# Patient Record
Sex: Female | Born: 2000 | Race: White | Hispanic: No | Marital: Single | Smoking: Never smoker
Health system: Southern US, Community
[De-identification: ages and names within clinical notes are randomized; demographics above are authoritative.]

## PROBLEM LIST (undated history)

## (undated) DIAGNOSIS — S42302A Unspecified fracture of shaft of humerus, left arm, initial encounter for closed fracture: Secondary | ICD-10-CM

## (undated) DIAGNOSIS — O9852 Other viral diseases complicating childbirth: Secondary | ICD-10-CM

## (undated) HISTORY — DX: Unspecified fracture of shaft of humerus, left arm, initial encounter for closed fracture: S42.302A

## (undated) HISTORY — PX: NO PAST SURGERIES: SHX2092

---
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
## 2018-01-02 MED ORDER — ALPRAZOLAM 0.25 MG PO TABS: 0.2500 mg | ORAL_TABLET | Freq: Two times a day (BID) | ORAL | 0 refills | Status: DC | PRN

## 2018-01-02 NOTE — Telephone Encounter (Addendum)
Spoke with patient to let her know that we can send in 10 tablets, however she needs to follow-up with [facility] for any future refills.  Patient stated verbal understanding and made and appointment for November 13th.  Xanax RX has been sent to the [facility] on [address]

## 2018-01-02 NOTE — Telephone Encounter (Signed)
Please call pt: will refill # 10 but needs OV with cody in next week or 2 to f/u on anxiety. And need to clarify pharmacy.

## 2018-01-21 ENCOUNTER — Ambulatory Visit: Payer: Managed Care, Other (non HMO) | Admitting: Physician Assistant

## 2018-01-26 ENCOUNTER — Ambulatory Visit: Payer: Managed Care, Other (non HMO) | Admitting: Physician Assistant

## 2018-02-09 ENCOUNTER — Encounter: Payer: Self-pay | Admitting: Physician Assistant

## 2018-02-09 ENCOUNTER — Ambulatory Visit: Payer: Managed Care, Other (non HMO) | Admitting: Physician Assistant

## 2018-02-09 ENCOUNTER — Other Ambulatory Visit: Payer: Self-pay

## 2018-02-09 VITALS — BP 122/80 | HR 83 | Temp 98.3°F | Resp 14 | Ht 65.0 in | Wt 159.0 lb

## 2018-02-09 DIAGNOSIS — F411 Generalized anxiety disorder: Secondary | ICD-10-CM | POA: Diagnosis not present

## 2018-02-09 MED ORDER — CITALOPRAM HYDROBROMIDE 10 MG PO TABS: 10.0000 mg | ORAL_TABLET | Freq: Every day | ORAL | 1 refills | Status: DC

## 2018-02-09 NOTE — Assessment & Plan Note (Signed)
Will start Citalopram 10 mg tablet daily. Continue Xanax on PRN basis for severe acute anxiety only. Follow-up 6 weeks after the holiday season.

## 2018-02-09 NOTE — Progress Notes (Signed)
Patient presents to clinic today for follow-up of anxiety. Notes that the xanax does help with panic attacks at work but does make her sleepy. Notes that she is noting more generalized anxiety as well with work. Notes she is a worry wart and gets herself worked up over the smallest things. Denies depressed mood or anhedonia. Denies SI/HI.   Past Medical History:  Diagnosis Date  . Closed left arm fracture     Current Outpatient Medications on File Prior to Visit  Medication Sig Dispense Refill  . ALPRAZolam (XANAX) 0.25 MG tablet Take 1 tablet (0.25 mg total) by mouth 2 (two) times daily as needed for anxiety. 10 tablet 0  . ENSKYCE 0.15-30 MG-MCG tablet Take 1 tablet by mouth daily.  4  . fluticasone (FLONASE) 50 MCG/ACT nasal spray Place 2 sprays into both nostrils daily. 16 g 0   No current facility-administered medications on file prior to visit.     No Known Allergies  Family History  Problem Relation Age of Onset  . Hypertension Mother   . Diabetes Father   . Hypertension Maternal Uncle   . Diabetes Maternal Uncle   . Hypertension Maternal Grandmother   . Hypertension Maternal Grandfather   . Cancer Maternal Grandfather        Lung and Bladder  . Diabetes Maternal Grandfather   . Healthy Sister        x 4    Social History   Socioeconomic History  . Marital status: Single    Spouse name: Not on file  . Number of children: 0  . Years of education: Not on file  . Highest education level: Not on file  Occupational History  . Occupation: Consulting civil engineer    Comment: Recruitment consultant  Social Needs  . Financial resource strain: Not on file  . Food insecurity:    Worry: Not on file    Inability: Not on file  . Transportation needs:    Medical: Not on file    Non-medical: Not on file  Tobacco Use  . Smoking status: Never Smoker  . Smokeless tobacco: Never Used  Substance and Sexual Activity  . Alcohol use: No  . Drug use: No  . Sexual activity: Never    Birth  control/protection: Pill  Lifestyle  . Physical activity:    Days per week: Not on file    Minutes per session: Not on file  . Stress: Not on file  Relationships  . Social connections:    Talks on phone: Not on file    Gets together: Not on file    Attends religious service: Not on file    Active member of club or organization: Not on file    Attends meetings of clubs or organizations: Not on file    Relationship status: Not on file  Other Topics Concern  . Not on file  Social History Narrative  . Not on file   Review of Systems - See HPI.  All other ROS are negative.  BP 122/80   Pulse 83   Temp 98.3 F (36.8 C) (Oral)   Resp 14   Ht 5\' 5"  (1.651 m)   Wt 159 lb (72.1 kg)   SpO2 99%   BMI 26.46 kg/m   Physical Exam  Constitutional: She is oriented to person, place, and time. She appears well-developed and well-nourished.  HENT:  Head: Normocephalic and atraumatic.  Right Ear: External ear normal.  Left Ear: External ear normal.  Eyes: Conjunctivae  are normal.  Neck: Neck supple.  Cardiovascular: Normal rate, regular rhythm, normal heart sounds and intact distal pulses.  Pulmonary/Chest: Effort normal and breath sounds normal. No stridor. No respiratory distress. She has no wheezes. She has no rales. She exhibits no tenderness.  Neurological: She is alert and oriented to person, place, and time.  Psychiatric: She has a normal mood and affect.  Vitals reviewed.  Assessment/Plan: Generalized anxiety disorder Will start Citalopram 10 mg tablet daily. Continue Xanax on PRN basis for severe acute anxiety only. Follow-up 6 weeks after the holiday season.      Piedad ClimesWilliam Cody Padraig Nhan, PA-C

## 2018-02-09 NOTE — Patient Instructions (Addendum)
Please start the Citalopram 10 mg tablet, taking daily as directed.  Continue the Xanax on an as needed basis for severe acute anxiety only.  Keep active and eat a well-balanced diet.  Follow-up in 6-8 weeks for reassessment. Return sooner if needed.

## 2018-03-11 NOTE — L&D Delivery Note (Signed)
Delivery Note Pt pushed very well for about 33minutes.  At 7:01 PM a viable and healthy female was delivered via Vaginal, Spontaneous (Presentation: OA; LOT ).  APGAR: 8, 9; weight  P.   Placenta status: delivered, intact .  Cord: 3VC  with the following complications: none.    Anesthesia:  epidural Episiotomy: None Lacerations: Labial Suture Repair: 3.0 vicryl rapide Est. Blood Loss (mL): 256cc  Mom to postpartum.  Baby to Couplet care / Skin to Skin.  Federick Levene Bovard-Stuckert 12/26/2018, 7:51 PM  Circumcision d/w pt r/b/a, wishes to proceed in office  A+/RI/Tdap in office/Contra?/Bo

## 2018-03-20 ENCOUNTER — Other Ambulatory Visit: Payer: Self-pay

## 2018-03-20 ENCOUNTER — Ambulatory Visit: Payer: Managed Care, Other (non HMO) | Admitting: Physician Assistant

## 2018-03-20 ENCOUNTER — Encounter: Payer: Self-pay | Admitting: Physician Assistant

## 2018-03-20 VITALS — BP 102/78 | HR 80 | Temp 98.3°F | Resp 16 | Ht 65.0 in | Wt 154.0 lb

## 2018-03-20 DIAGNOSIS — J069 Acute upper respiratory infection, unspecified: Secondary | ICD-10-CM

## 2018-03-20 DIAGNOSIS — B9789 Other viral agents as the cause of diseases classified elsewhere: Secondary | ICD-10-CM | POA: Diagnosis not present

## 2018-03-20 DIAGNOSIS — H66001 Acute suppurative otitis media without spontaneous rupture of ear drum, right ear: Secondary | ICD-10-CM

## 2018-03-20 MED ORDER — AMOXICILLIN 875 MG PO TABS: 875.0000 mg | ORAL_TABLET | Freq: Two times a day (BID) | ORAL | 0 refills | Status: DC

## 2018-03-20 MED ORDER — HYDROCOD POLST-CPM POLST ER 10-8 MG/5ML PO SUER: 5.0000 mL | Freq: Two times a day (BID) | ORAL | 0 refills | Status: DC | PRN

## 2018-03-20 NOTE — Patient Instructions (Signed)
Please keep well-hydrated and get plenty of rest. Start a saline nasal rinse daily. Salt-water gargles and tylenol or ibuprofen will be beneficial for sore throat. Take the prescription cough medication as directed.  If you not worsening ear pressure or pain over the weekend, fill the prescription for the antibiotic and take as directed.

## 2018-03-20 NOTE — Progress Notes (Signed)
Acute Office Visit  Subjective:    Patient ID: Ashley Buckley, female    DOB: 2001-02-24, 18 y.o.   MRN: [id number]  Chief Complaint  Patient presents with  . URI    Patient is in today for 4 days of dry cough, sore throat, nasal congestion and post-nasal drip. Notes initially having a fever with Tmax of 102 but this resolved on day 2 of symptoms. Denies recurrence since. Notes mild headache and sinus pressure. Feels that symptoms are improving overall but cough is keeping her up at night. Denies recent travel or sick contact.  Past Medical History:  Diagnosis Date  . Closed left arm fracture     Past Surgical History:  Procedure Laterality Date  . NO PAST SURGERIES      Family History  Problem Relation Age of Onset  . Hypertension Mother   . Diabetes Father   . Hypertension Maternal Uncle   . Diabetes Maternal Uncle   . Hypertension Maternal Grandmother   . Hypertension Maternal Grandfather   . Cancer Maternal Grandfather        Lung and Bladder  . Diabetes Maternal Grandfather   . Healthy Sister        x 4    Social History   Socioeconomic History  . Marital status: Single    Spouse name: Not on file  . Number of children: 0  . Years of education: Not on file  . Highest education level: Not on file  Occupational History  . Occupation: Consulting civil engineer    Comment: Recruitment consultant  Social Needs  . Financial resource strain: Not on file  . Food insecurity:    Worry: Not on file    Inability: Not on file  . Transportation needs:    Medical: Not on file    Non-medical: Not on file  Tobacco Use  . Smoking status: Never Smoker  . Smokeless tobacco: Never Used  Substance and Sexual Activity  . Alcohol use: No  . Drug use: No  . Sexual activity: Never    Birth control/protection: Pill  Lifestyle  . Physical activity:    Days per week: Not on file    Minutes per session: Not on file  . Stress: Not on file  Relationships  . Social connections:    Talks on  phone: Not on file    Gets together: Not on file    Attends religious service: Not on file    Active member of club or organization: Not on file    Attends meetings of clubs or organizations: Not on file    Relationship status: Not on file  . Intimate partner violence:    Fear of current or ex partner: Not on file    Emotionally abused: Not on file    Physically abused: Not on file    Forced sexual activity: Not on file  Other Topics Concern  . Not on file  Social History Narrative  . Not on file    Outpatient Medications Prior to Visit  Medication Sig Dispense Refill  . ALPRAZolam (XANAX) 0.25 MG tablet Take 1 tablet (0.25 mg total) by mouth 2 (two) times daily as needed for anxiety. 10 tablet 0  . citalopram (CELEXA) 10 MG tablet Take 1 tablet (10 mg total) by mouth daily. 30 tablet 1  . ENSKYCE 0.15-30 MG-MCG tablet Take 1 tablet by mouth daily.  4  . fluticasone (FLONASE) 50 MCG/ACT nasal spray Place 2 sprays into both nostrils daily. (  Patient not taking: Reported on 03/20/2018) 16 g 0   No facility-administered medications prior to visit.     No Known Allergies  Review of Systems  Constitutional: Positive for malaise/fatigue.  HENT: Positive for congestion and sinus pain. Negative for ear pain.   Eyes: Negative.   Respiratory: Positive for cough.   Cardiovascular: Positive for chest pain.  Gastrointestinal: Negative for diarrhea, nausea and vomiting.  Genitourinary: Negative.   Musculoskeletal: Negative for back pain, falls and neck pain.  Neurological: Positive for headaches. Negative for dizziness.       Objective:    Physical Exam  Constitutional: She is oriented to person, place, and time. She appears well-developed and well-nourished.  HENT:  Head: Normocephalic.  Right Ear: Tympanic membrane is erythematous.  Left Ear: Tympanic membrane is erythematous.  Mouth/Throat: Uvula is midline and mucous membranes are normal. Posterior oropharyngeal erythema  present.  Eyes: Pupils are equal, round, and reactive to light. Conjunctivae are normal.  Neck: Normal range of motion. Neck supple.  Cardiovascular: Normal rate, regular rhythm and normal heart sounds.  Pulmonary/Chest: Effort normal and breath sounds normal.  Neurological: She is alert and oriented to person, place, and time.  Skin: Skin is warm and dry.    BP 102/78   Pulse 80   Temp 98.3 F (36.8 C) (Oral)   Resp 16   Ht 5\' 5"  (1.651 m)   Wt 69.9 kg   SpO2 99%   BMI 25.63 kg/m  Wt Readings from Last 3 Encounters:  03/20/18 69.9 kg (83 %, Z= 0.97)*  02/09/18 72.1 kg (87 %, Z= 1.12)*  11/19/17 73.5 kg (89 %, Z= 1.21)*   * Growth percentiles are based on CDC (Girls, 2-20 Years) data.    There are no preventive care reminders to display for this patient.  There are no preventive care reminders to display for this patient.   No results found for: TSH Lab Results  Component Value Date   WBC 10.3 07/23/2017   HGB 14.7 07/23/2017   HCT 43.3 07/23/2017   MCV 93.7 07/23/2017   PLT 340.0 07/23/2017   Lab Results  Component Value Date   NA 140 07/23/2017   K 4.0 07/23/2017   CO2 23 07/23/2017   GLUCOSE 90 07/23/2017   BUN 8 07/23/2017   CREATININE 0.75 07/23/2017   BILITOT 0.5 07/23/2017   ALKPHOS 23 (L) 07/23/2017   AST 9 07/23/2017   ALT 11 07/23/2017   PROT 7.6 07/23/2017   ALBUMIN 4.6 07/23/2017   CALCIUM 9.8 07/23/2017   GFR 105.67 07/23/2017   No results found for: CHOL No results found for: HDL No results found for: LDLCALC No results found for: TRIG No results found for: CHOLHDL No results found for: WGYK5L     Assessment & Plan:   1. Viral URI with cough Supportive measures and OTC medications reviewed. Rx Tussionex for cough. Follow-up if not continuing to improve.  2. Non-recurrent acute suppurative otitis media of right ear without spontaneous rupture of tympanic membrane Mild erythema and yellow fluid noted behind TM. No bulging noted. Will  have her monitor for ear pain over the next 24-48 hours. If recurring she is to start Amoxicillin and take as directed.   No orders of the defined types were placed in this encounter.    Erin E Mecum, Student-PA

## 2018-04-06 ENCOUNTER — Ambulatory Visit: Payer: Managed Care, Other (non HMO) | Admitting: Physician Assistant

## 2018-04-06 DIAGNOSIS — Z0289 Encounter for other administrative examinations: Secondary | ICD-10-CM

## 2018-06-03 LAB — HIV ANTIBODY (ROUTINE TESTING W REFLEX): HIV 1&2 Ab, 4th Generation: NEGATIVE

## 2018-12-24 ENCOUNTER — Other Ambulatory Visit: Payer: Self-pay

## 2018-12-24 ENCOUNTER — Ambulatory Visit (INDEPENDENT_AMBULATORY_CARE_PROVIDER_SITE_OTHER): Payer: Managed Care, Other (non HMO) | Admitting: Pediatrics

## 2018-12-24 DIAGNOSIS — Z7681 Expectant parent(s) prebirth pediatrician visit: Secondary | ICD-10-CM

## 2018-12-24 NOTE — Progress Notes (Signed)
Prenatal counseling for impending newborn done-- visit done over phone.  1st child, no complications, currently 38.2wks, prenatal early Z88.81

## 2018-12-26 ENCOUNTER — Inpatient Hospital Stay (HOSPITAL_COMMUNITY): Payer: Managed Care, Other (non HMO) | Admitting: Anesthesiology

## 2018-12-26 ENCOUNTER — Inpatient Hospital Stay (HOSPITAL_COMMUNITY)
Admission: AD | Admit: 2018-12-26 | Discharge: 2018-12-28 | DRG: 805 | Disposition: A | Discharge status: Home / Self Care | Payer: Managed Care, Other (non HMO) | Attending: Obstetrics and Gynecology | Admitting: Obstetrics and Gynecology

## 2018-12-26 ENCOUNTER — Encounter (HOSPITAL_COMMUNITY): Payer: Self-pay | Admitting: Student

## 2018-12-26 ENCOUNTER — Other Ambulatory Visit: Payer: Self-pay

## 2018-12-26 DIAGNOSIS — Z3A38 38 weeks gestation of pregnancy: Secondary | ICD-10-CM

## 2018-12-26 DIAGNOSIS — O9852 Other viral diseases complicating childbirth: Principal | ICD-10-CM

## 2018-12-26 DIAGNOSIS — Z3493 Encounter for supervision of normal pregnancy, unspecified, third trimester: Secondary | ICD-10-CM

## 2018-12-26 DIAGNOSIS — E669 Obesity, unspecified: Secondary | ICD-10-CM | POA: Diagnosis present

## 2018-12-26 DIAGNOSIS — O26893 Other specified pregnancy related conditions, third trimester: Secondary | ICD-10-CM | POA: Diagnosis present

## 2018-12-26 DIAGNOSIS — U071 COVID-19: Secondary | ICD-10-CM | POA: Diagnosis present

## 2018-12-26 DIAGNOSIS — O99214 Obesity complicating childbirth: Secondary | ICD-10-CM | POA: Diagnosis present

## 2018-12-26 HISTORY — DX: Other viral diseases complicating childbirth: O98.52

## 2018-12-26 HISTORY — DX: COVID-19: U07.1

## 2018-12-26 LAB — CBC
HCT: 37.5 % (ref 36.0–46.0)
Hemoglobin: 12.3 g/dL (ref 12.0–15.0)
MCH: 30.3 pg (ref 26.0–34.0)
MCHC: 32.8 g/dL (ref 30.0–36.0)
MCV: 92.4 fL (ref 80.0–100.0)
Platelets: 328 10*3/uL (ref 150–400)
RBC: 4.06 MIL/uL (ref 3.87–5.11)
RDW: 12.4 % (ref 11.5–15.5)
WBC: 12.7 10*3/uL — ABNORMAL HIGH (ref 4.0–10.5)
nRBC: 0 % (ref 0.0–0.2)

## 2018-12-26 LAB — COMPREHENSIVE METABOLIC PANEL
ALT: 16 U/L (ref 0–44)
AST: 18 U/L (ref 15–41)
Albumin: 3.1 g/dL — ABNORMAL LOW (ref 3.5–5.0)
Alkaline Phosphatase: 134 U/L — ABNORMAL HIGH (ref 38–126)
Anion gap: 12 (ref 5–15)
BUN: 5 mg/dL — ABNORMAL LOW (ref 6–20)
CO2: 19 mmol/L — ABNORMAL LOW (ref 22–32)
Calcium: 8.8 mg/dL — ABNORMAL LOW (ref 8.9–10.3)
Chloride: 107 mmol/L (ref 98–111)
Creatinine, Ser: 0.73 mg/dL (ref 0.44–1.00)
GFR calc Af Amer: >60 mL/min (ref >60)
GFR calc non Af Amer: >60 mL/min (ref >60)
Glucose, Bld: 84 mg/dL (ref 70–99)
Potassium: 3.8 mmol/L (ref 3.5–5.1)
Sodium: 138 mmol/L (ref 135–145)
Total Bilirubin: 0.8 mg/dL (ref 0.3–1.2)
Total Protein: 6.4 g/dL — ABNORMAL LOW (ref 6.5–8.1)

## 2018-12-26 LAB — TYPE AND SCREEN
ABO/RH(D): A POS
Antibody Screen: NEGATIVE

## 2018-12-26 LAB — SARS CORONAVIRUS 2 BY RT PCR (HOSPITAL ORDER, PERFORMED IN ~~LOC~~ HOSPITAL LAB): SARS Coronavirus 2: POSITIVE — AB

## 2018-12-26 LAB — PROTEIN / CREATININE RATIO, URINE
Creatinine, Urine: 100.6 mg/dL
Protein Creatinine Ratio: 0.09 mg/mg{Cre} (ref 0.00–0.15)
Total Protein, Urine: 9 mg/dL

## 2018-12-26 LAB — ABO/RH: ABO/RH(D): A POS

## 2018-12-26 LAB — POCT FERN TEST: POCT Fern Test: NEGATIVE

## 2018-12-26 MED ORDER — LACTATED RINGERS IV SOLN
INTRAVENOUS | Status: DC
  Administered 2018-12-26: 15:00:00 via INTRAVENOUS

## 2018-12-26 MED ORDER — LACTATED RINGERS IV SOLN: 500.0000 mL | INTRAVENOUS | Status: DC | PRN

## 2018-12-26 MED ORDER — OXYTOCIN 40 UNITS IN NORMAL SALINE INFUSION - SIMPLE MED: 2.5000 [IU]/h | INTRAVENOUS | Status: DC

## 2018-12-26 MED ORDER — ONDANSETRON HCL 4 MG/2ML IJ SOLN
4.0000 mg | Freq: Four times a day (QID) | INTRAMUSCULAR | Status: DC | PRN
  Administered 2018-12-26: 4 mg via INTRAVENOUS
  Filled 2018-12-26: qty 2

## 2018-12-26 MED ORDER — OXYCODONE HCL 5 MG PO TABS: 5.0000 mg | ORAL_TABLET | ORAL | Status: DC | PRN

## 2018-12-26 MED ORDER — PRENATAL MULTIVITAMIN CH
1.0000 | ORAL_TABLET | Freq: Every day | ORAL | Status: DC
  Administered 2018-12-27 – 2018-12-28 (×2): 1 via ORAL
  Filled 2018-12-26 (×2): qty 1

## 2018-12-26 MED ORDER — PHENYLEPHRINE 40 MCG/ML (10ML) SYRINGE FOR IV PUSH (FOR BLOOD PRESSURE SUPPORT)
80.0000 ug | PREFILLED_SYRINGE | INTRAVENOUS | Status: DC | PRN
  Filled 2018-12-26: qty 10

## 2018-12-26 MED ORDER — ONDANSETRON HCL 4 MG/2ML IJ SOLN: 4.0000 mg | INTRAMUSCULAR | Status: DC | PRN

## 2018-12-26 MED ORDER — FENTANYL-BUPIVACAINE-NACL 0.5-0.125-0.9 MG/250ML-% EP SOLN
12.0000 mL/h | EPIDURAL | Status: DC | PRN
  Filled 2018-12-26: qty 250

## 2018-12-26 MED ORDER — OXYCODONE-ACETAMINOPHEN 5-325 MG PO TABS: 2.0000 | ORAL_TABLET | ORAL | Status: DC | PRN

## 2018-12-26 MED ORDER — OXYCODONE-ACETAMINOPHEN 5-325 MG PO TABS: 1.0000 | ORAL_TABLET | ORAL | Status: DC | PRN

## 2018-12-26 MED ORDER — OXYTOCIN 40 UNITS IN NORMAL SALINE INFUSION - SIMPLE MED
2.5000 [IU]/h | INTRAVENOUS | Status: DC
  Filled 2018-12-26: qty 1000

## 2018-12-26 MED ORDER — FLEET ENEMA 7-19 GM/118ML RE ENEM: 1.0000 | ENEMA | RECTAL | Status: DC | PRN

## 2018-12-26 MED ORDER — BUTORPHANOL TARTRATE 1 MG/ML IJ SOLN: 1.0000 mg | INTRAMUSCULAR | Status: DC | PRN

## 2018-12-26 MED ORDER — OXYTOCIN BOLUS FROM INFUSION: 500.0000 mL | Freq: Once | INTRAVENOUS | Status: DC

## 2018-12-26 MED ORDER — SOD CITRATE-CITRIC ACID 500-334 MG/5ML PO SOLN: 30.0000 mL | ORAL | Status: DC | PRN

## 2018-12-26 MED ORDER — ACETAMINOPHEN 325 MG PO TABS: 650.0000 mg | ORAL_TABLET | ORAL | Status: DC | PRN

## 2018-12-26 MED ORDER — TERBUTALINE SULFATE 1 MG/ML IJ SOLN: 0.2500 mg | Freq: Once | INTRAMUSCULAR | Status: DC | PRN

## 2018-12-26 MED ORDER — LACTATED RINGERS IV SOLN
500.0000 mL | Freq: Once | INTRAVENOUS | Status: AC
  Administered 2018-12-26: 500 mL via INTRAVENOUS

## 2018-12-26 MED ORDER — ONDANSETRON HCL 4 MG/2ML IJ SOLN: 4.0000 mg | Freq: Four times a day (QID) | INTRAMUSCULAR | Status: DC | PRN

## 2018-12-26 MED ORDER — BENZOCAINE-MENTHOL 20-0.5 % EX AERO
1.0000 "application " | INHALATION_SPRAY | CUTANEOUS | Status: DC | PRN
  Administered 2018-12-27 – 2018-12-28 (×2): 1 via TOPICAL
  Filled 2018-12-26 (×2): qty 56

## 2018-12-26 MED ORDER — COCONUT OIL OIL: 1.0000 "application " | TOPICAL_OIL | Status: DC | PRN

## 2018-12-26 MED ORDER — EPHEDRINE 5 MG/ML INJ: 10.0000 mg | INTRAVENOUS | Status: DC | PRN

## 2018-12-26 MED ORDER — SODIUM CHLORIDE (PF) 0.9 % IJ SOLN
INTRAMUSCULAR | Status: DC | PRN
  Administered 2018-12-26: 12 mL/h via EPIDURAL

## 2018-12-26 MED ORDER — SENNOSIDES-DOCUSATE SODIUM 8.6-50 MG PO TABS
2.0000 | ORAL_TABLET | ORAL | Status: DC
  Administered 2018-12-27 (×2): 2 via ORAL
  Filled 2018-12-26 (×2): qty 2

## 2018-12-26 MED ORDER — SIMETHICONE 80 MG PO CHEW: 80.0000 mg | CHEWABLE_TABLET | ORAL | Status: DC | PRN

## 2018-12-26 MED ORDER — PHENYLEPHRINE 40 MCG/ML (10ML) SYRINGE FOR IV PUSH (FOR BLOOD PRESSURE SUPPORT): 80.0000 ug | PREFILLED_SYRINGE | INTRAVENOUS | Status: DC | PRN

## 2018-12-26 MED ORDER — WITCH HAZEL-GLYCERIN EX PADS: 1.0000 "application " | MEDICATED_PAD | CUTANEOUS | Status: DC | PRN

## 2018-12-26 MED ORDER — DIPHENHYDRAMINE HCL 50 MG/ML IJ SOLN: 12.5000 mg | INTRAMUSCULAR | Status: DC | PRN

## 2018-12-26 MED ORDER — ZOLPIDEM TARTRATE 5 MG PO TABS: 5.0000 mg | ORAL_TABLET | Freq: Every evening | ORAL | Status: DC | PRN

## 2018-12-26 MED ORDER — LIDOCAINE HCL (PF) 1 % IJ SOLN: 30.0000 mL | INTRAMUSCULAR | Status: DC | PRN

## 2018-12-26 MED ORDER — LACTATED RINGERS IV SOLN
INTRAVENOUS | Status: DC
  Administered 2018-12-26: 11:00:00 via INTRAVENOUS

## 2018-12-26 MED ORDER — IBUPROFEN 600 MG PO TABS
600.0000 mg | ORAL_TABLET | Freq: Four times a day (QID) | ORAL | Status: DC
  Administered 2018-12-27 – 2018-12-28 (×7): 600 mg via ORAL
  Filled 2018-12-26 (×7): qty 1

## 2018-12-26 MED ORDER — LIDOCAINE HCL (PF) 1 % IJ SOLN
INTRAMUSCULAR | Status: DC | PRN
  Administered 2018-12-26: 5 mL via EPIDURAL
  Administered 2018-12-26: 4 mL via EPIDURAL

## 2018-12-26 MED ORDER — CITALOPRAM HYDROBROMIDE 10 MG PO TABS
10.0000 mg | ORAL_TABLET | Freq: Every day | ORAL | Status: DC
  Filled 2018-12-26: qty 1

## 2018-12-26 MED ORDER — DIPHENHYDRAMINE HCL 25 MG PO CAPS: 25.0000 mg | ORAL_CAPSULE | Freq: Four times a day (QID) | ORAL | Status: DC | PRN

## 2018-12-26 MED ORDER — ONDANSETRON HCL 4 MG PO TABS: 4.0000 mg | ORAL_TABLET | ORAL | Status: DC | PRN

## 2018-12-26 MED ORDER — LACTATED RINGERS IV SOLN: INTRAVENOUS | Status: DC

## 2018-12-26 MED ORDER — DIBUCAINE (PERIANAL) 1 % EX OINT: 1.0000 "application " | TOPICAL_OINTMENT | CUTANEOUS | Status: DC | PRN

## 2018-12-26 MED ORDER — OXYTOCIN BOLUS FROM INFUSION
500.0000 mL | Freq: Once | INTRAVENOUS | Status: AC
  Administered 2018-12-26: 500 mL via INTRAVENOUS

## 2018-12-26 MED ORDER — OXYCODONE HCL 5 MG PO TABS: 10.0000 mg | ORAL_TABLET | ORAL | Status: DC | PRN

## 2018-12-26 NOTE — Anesthesia Preprocedure Evaluation (Addendum)
Anesthesia Evaluation  Patient identified by MRN, date of birth, ID band Patient awake    Reviewed: Allergy & Precautions, NPO status , Patient's Chart, lab work & pertinent test results  History of Anesthesia Complications Negative for: history of anesthetic complications  Airway Mallampati: II   Neck ROM: Full    Dental no notable dental hx.    Pulmonary neg pulmonary ROS,    Pulmonary exam normal        Cardiovascular negative cardio ROS Normal cardiovascular exam     Neuro/Psych PSYCHIATRIC DISORDERS Anxiety negative neurological ROS     GI/Hepatic negative GI ROS, Neg liver ROS,   Endo/Other   Obesity   Renal/GU negative Renal ROS     Musculoskeletal negative musculoskeletal ROS (+)   Abdominal   Peds  Hematology negative hematology ROS (+)  Plt 328k    Anesthesia Other Findings Covid+  Reproductive/Obstetrics (+) Pregnancy                          Anesthesia Physical Anesthesia Plan  ASA: II  Anesthesia Plan: Epidural   Post-op Pain Management:    Induction:   PONV Risk Score and Plan: 2 and Treatment may vary due to age or medical condition  Airway Management Planned: Natural Airway  Additional Equipment: None  Intra-op Plan:   Post-operative Plan:   Informed Consent: I have reviewed the patients History and Physical, chart, labs and discussed the procedure including the risks, benefits and alternatives for the proposed anesthesia with the patient or authorized representative who has indicated his/her understanding and acceptance.       Plan Discussed with: Anesthesiologist  Anesthesia Plan Comments: (Labs reviewed. Platelets acceptable, patient not taking any blood thinning medications. Per RN, FHR tracing reported to be stable enough for sitting procedure. Risks and benefits discussed with patient, including PDPH, backache, epidural hematoma, failed  epidural, blood pressure changes, allergic reaction, and nerve injury. Patient expressed understanding and wished to proceed.)       Anesthesia Quick Evaluation

## 2018-12-26 NOTE — Anesthesia Procedure Notes (Signed)
Epidural Patient location during procedure: OB Start time: 12/26/2018 3:11 PM End time: 12/26/2018 3:15 PM  Staffing Anesthesiologist: Audry Pili, MD Performed: anesthesiologist   Preanesthetic Checklist Completed: patient identified, pre-op evaluation, timeout performed, IV checked, risks and benefits discussed and monitors and equipment checked  Epidural Patient position: sitting Prep: DuraPrep Patient monitoring: continuous pulse ox and blood pressure Approach: midline Location: L2-L3 Injection technique: LOR saline  Needle:  Needle type: Tuohy  Needle gauge: 17 G Needle length: 9 cm Needle insertion depth: 6 cm Catheter size: 19 Gauge Catheter at skin depth: 11 cm Test dose: negative and Other (1% lidocaine)  Assessment Events: blood not aspirated  Additional Notes Patient identified. Risks including, but not limited to, bleeding, infection, nerve damage, paralysis, inadequate analgesia, blood pressure changes, nausea, vomiting, allergic reaction, postpartum back pain, itching, and headache were discussed. Patient expressed understanding and wished to proceed. Sterile prep and drape, including hand hygiene, mask, and sterile gloves were used. The patient was positioned and the spine was prepped. The skin was anesthetized with lidocaine. No paraesthesia or other complication noted. The patient did not experience any signs of intravascular injection such as tinnitus or metallic taste in mouth, nor signs of intrathecal spread such as rapid motor block. Please see nursing notes for vital signs. The patient tolerated the procedure well.   Ashley Buckley, MDReason for block:procedure for pain

## 2018-12-26 NOTE — H&P (Signed)
Ashley Buckley is a 18 y.o. female G1 P0 at 38+ with increasing ctx and increased discharge.  Pt with cervical change since last exam.  Relatively uncomplicated PNC.  Had h/o low-lying placenta - resolved.  Received Tdap and Flu in [facility].  Normal genetic screening.    OB History    Gravida  1   Para      Term      Preterm      AB      Living        SAB      TAB      Ectopic      Multiple      Live Births            G1 present  No pap H/o Chl, neg testing in pregnancy  Past Medical History:  Diagnosis Date  . Closed left arm fracture    Past Surgical History:  Procedure Laterality Date  . NO PAST SURGERIES     Family History: family history includes Cancer in her maternal grandfather; Diabetes in her father, maternal grandfather, and maternal uncle; Healthy in her sister; Hypertension in her maternal grandfather, maternal grandmother, maternal uncle, and mother. Social History:  reports that she has never smoked. She has never used smokeless tobacco. She reports that she does not drink alcohol or use drugs.insurance agency, single in long-term relationship  Meds PNV All NKDA     Maternal Diabetes: No Genetic Screening: Normal Maternal Ultrasounds/Referrals: Normal Fetal Ultrasounds or other Referrals:  None Maternal Substance Abuse:  No Significant Maternal Medications:  None Significant Maternal Lab Results:  Group B Strep negative Other Comments:  None  Review of Systems  Constitutional: Negative.   HENT: Negative.   Eyes: Negative.   Respiratory: Negative.   Cardiovascular: Negative.   Gastrointestinal: Negative.   Genitourinary: Negative.   Musculoskeletal: Positive for back pain.  Skin: Negative.   Neurological: Negative.   Psychiatric/Behavioral: Negative.    Maternal Medical History:  Reason for admission: Contractions.   Contractions: Frequency: regular.   Perceived severity is moderate.    Fetal activity: Perceived fetal activity  is normal.    Prenatal Complications - Diabetes: none.    Dilation: 6 Effacement (%): 80 Station: -3 Exam by:: christina robinson rn Blood pressure 130/89, pulse 93, temperature 98.5 F (36.9 C), temperature source Oral, resp. rate 17, height 5\' 5"  (1.651 m), weight 83.9 kg, SpO2 100 %. Maternal Exam:  Uterine Assessment: Contraction strength is moderate.  Contraction frequency is regular.   Abdomen: Patient reports no abdominal tenderness. Fundal height is appropriate for gestation.   Estimated fetal weight is 7.5#.   Fetal presentation: vertex  Introitus: Normal vulva. Normal vagina.    Physical Exam  Constitutional: She is oriented to person, place, and time. She appears well-developed and well-nourished.  HENT:  Head: Normocephalic and atraumatic.  Cardiovascular: Normal rate and regular rhythm.  Respiratory: Effort normal and breath sounds normal. No respiratory distress. She has no wheezes.  GI: Soft. Bowel sounds are normal. She exhibits no distension. There is no abdominal tenderness.  Genitourinary:    Vulva normal.   Musculoskeletal: Normal range of motion.  Neurological: She is alert and oriented to person, place, and time.  Skin: Skin is warm and dry.  Psychiatric: She has a normal mood and affect. Her behavior is normal.    Prenatal labs: ABO, Rh: --/--/A POS, A POS Performed at [facility], [address] [place], [place]  (  10/17 1104) Antibody: NEG (10/17 1104) Rubella:  immune RPR:   NR HBsAg:   neg HIV:   neg GBS:   neg  Tdap 8/12, Flu 3/20  Hgb 14/2/plt 309/Ur Cx neg/GC neg/Chl neg/Varicella immune/Hgb electro WNL/First tri screen WNL/glucola 124 Essential panel neg (CF, SMA, fragile X neg)  [place] cwd, nl NT, isolated CP cysts Had limited anat x 2; Completed normal anat Boy - circ in office paid Assessment/Plan: 18yo G1P0 at 38+ in labor Plan for epidural or betamethasone for pain Expect SVD Augment prn   Wilmont Olund  Bovard-Stuckert 12/26/2018, 12:27 PM

## 2018-12-26 NOTE — MAU Note (Signed)
Ashley Buckley is a 18 y.o. at [redacted]w[redacted]d here in MAU reporting: around 0530 she started timing contractions they were about every 5 minutes. States she is still feeling them but feels like they spaced out. On Thursday she started leaking and went to the office and they told her that the testing was neg for amniotic fluid. Had some more leaking last night, not wearing a pad, states no color or odor with the fluid. States she was 5 cm in the office this week. No bleeding, +FM. Last IC was 2 days ago.  Onset of complaint: today  Pain score: 7/10  Vitals:   12/26/18 1006  BP: (!) 141/82  Pulse: 94  Resp: 17  Temp: 98.5 F (36.9 C)  SpO2: 100%     FHT: +FM  Lab orders placed from triage: none

## 2018-12-27 ENCOUNTER — Encounter (HOSPITAL_COMMUNITY): Payer: Self-pay

## 2018-12-27 LAB — RPR: RPR Ser Ql: NONREACTIVE

## 2018-12-27 NOTE — Anesthesia Postprocedure Evaluation (Signed)
Anesthesia Post Note  Patient: Ashley Buckley  Procedure(s) Performed: AN AD HOC LABOR EPIDURAL     Patient location during evaluation: Mother Baby Anesthesia Type: Epidural Level of consciousness: awake and alert, oriented and patient cooperative Pain management: pain level controlled Vital Signs Assessment: post-procedure vital signs reviewed and stable Respiratory status: spontaneous breathing Cardiovascular status: stable Postop Assessment: no headache, epidural receding, no signs of nausea or vomiting and able to ambulate Anesthetic complications: no Comments: Pt. States she is walking.  Pain score 3.     Last Vitals:  Vitals:   12/27/18 0150 12/27/18 0610  BP: 121/71 119/86  Pulse: 75 71  Resp: 18 19  Temp: 36.9 C 37.1 C  SpO2:      Last Pain:  Vitals:   12/27/18 0610  TempSrc: Oral  PainSc: 0-No pain   Pain Goal:                   [facility]

## 2018-12-27 NOTE — Progress Notes (Signed)
Post Partum Day 1 - COVID + Subjective: no complaints, up ad lib, voiding, tolerating PO and nl lochia, pain controlled  Objective: Blood pressure 119/86, pulse 71, temperature 98.8 F (37.1 C), temperature source Oral, resp. rate 19, height 5\' 5"  (1.651 m), weight 83.9 kg, SpO2 98 %, unknown if currently breastfeeding.  Physical Exam:  General: alert and no distress Lochia: appropriate Uterine Fundus: firm   Recent Labs    12/26/18 1104  HGB 12.3  HCT 37.5    Assessment/Plan: Plan for discharge tomorrow.  Routine PP care.     LOS: 1 day   Ashley Buckley 12/27/2018, 7:56 AM

## 2018-12-27 NOTE — Plan of Care (Signed)
Pt ambulating in room without difficulty, Pt is ready to take a shower. Reinforced to patient if she experiences any weakness, or dizziness to call the nurse. Pt's significant remains at bedside.

## 2018-12-28 MED ORDER — ACETAMINOPHEN 325 MG PO TABS: 650.0000 mg | ORAL_TABLET | ORAL | 0 refills | Status: DC | PRN

## 2018-12-28 MED ORDER — IBUPROFEN 600 MG PO TABS: 600.0000 mg | ORAL_TABLET | Freq: Four times a day (QID) | ORAL | 0 refills | Status: DC

## 2018-12-28 NOTE — Discharge Summary (Signed)
OB Discharge Summary     Patient Name: Ashley Buckley DOB: 22-Jan-2001 MRN: [id number]  Date of admission: 12/26/2018 Delivering MD: Janyth Contes   Date of discharge: 12/28/2018  Admitting diagnosis: ctx 38 wks possible fluid leaking Intrauterine pregnancy: [redacted]w[redacted]d     Secondary diagnosis:  Principal Problem:   SVD (spontaneous vaginal delivery) Active Problems:   Indication for care in labor or delivery   Normal pregnancy in third trimester   COVID-19 affecting childbirth  Additional problems: none     Discharge diagnosis: Term Pregnancy Delivered                                                                                                Post partum procedures:isolation for COVID +  Augmentation: Pitocin  Complications: None  Hospital course:  Onset of Labor With Vaginal Delivery     18 y.o. yo G1P1001 at [redacted]w[redacted]d was admitted in Active Labor on 12/26/2018. Patient had an uncomplicated labor course as follows:  Membrane Rupture Time/Date: 3:26 PM ,12/26/2018   Intrapartum Procedures: Episiotomy: None [1]                                         Lacerations:  Labial [10]  Patient had a delivery of a Viable infant. 12/26/2018  Information for the patient's newborn:  Lorely, Bubb [[id number]]  Delivery Method: Vag-Spont(filed from delivery)     Pateint had an uncomplicated postpartum course.  She is ambulating, tolerating a regular diet, passing flatus, and urinating well. Patient is discharged home in stable condition on 12/28/18.   Physical exam  Vitals:   12/27/18 0610 12/27/18 1250 12/27/18 2000 12/28/18 0515  BP: 119/86 126/80 117/76 127/84  Pulse: 71 77 86 73  Resp: 19 18 18 18   Temp: 98.8 F (37.1 C) 98.3 F (36.8 C) 98.6 F (37 C) 98.4 F (36.9 C)  TempSrc: Oral Oral Oral Oral  SpO2:   99% 100%  Weight:      Height:       General: alert and cooperative Lochia: appropriate Uterine Fundus: firm  Labs: Lab Results  Component Value  Date   WBC 12.7 (H) 12/26/2018   HGB 12.3 12/26/2018   HCT 37.5 12/26/2018   MCV 92.4 12/26/2018   PLT 328 12/26/2018   CMP Latest Ref Rng & Units 12/26/2018  Glucose 70 - 99 mg/dL 84  BUN 6 - 20 mg/dL 5(L)  Creatinine 0.44 - 1.00 mg/dL 0.73  Sodium 135 - 145 mmol/L 138  Potassium 3.5 - 5.1 mmol/L 3.8  Chloride 98 - 111 mmol/L 107  CO2 22 - 32 mmol/L 19(L)  Calcium 8.9 - 10.3 mg/dL 8.8(L)  Total Protein 6.5 - 8.1 g/dL 6.4(L)  Total Bilirubin 0.3 - 1.2 mg/dL 0.8  Alkaline Phos 38 - 126 U/L 134(H)  AST 15 - 41 U/L 18  ALT 0 - 44 U/L 16    Discharge instruction: per After Visit Summary and "Baby and Me Booklet".  After visit meds:  Allergies  as of 12/28/2018   No Known Allergies     Medication List    TAKE these medications   acetaminophen 325 MG tablet Commonly known as: Tylenol Take 2 tablets (650 mg total) by mouth every 4 (four) hours as needed (for pain scale < 4).   ibuprofen 600 MG tablet Commonly known as: ADVIL Take 1 tablet (600 mg total) by mouth every 6 (six) hours.       Diet: routine diet  Activity: Advance as tolerated. Pelvic rest for 6 weeks.   Outpatient follow up:6 weeks Follow up Appt:No future appointments. Follow up Visit:No follow-ups on file.  Postpartum contraception: Undecided  Newborn Data: Live born female  Birth Weight: 8 lb 4.6 oz (3760 g) APGAR: 8, 9  Newborn Delivery   Birth date/time: 12/26/2018 19:01:00 Delivery type: Vaginal, Spontaneous      Baby Feeding: Bottle Disposition:home with mother Plans circ in office after 10 days  12/28/2018 Oliver Pila, MD

## 2018-12-28 NOTE — Progress Notes (Signed)
Post Partum Day 2 Subjective: no complaints, up ad lib and tolerating PO  Objective: Blood pressure 127/84, pulse 73, temperature 98.4 F (36.9 C), temperature source Oral, resp. rate 18, height 5\' 5"  (1.651 m), weight 83.9 kg, SpO2 100 %, unknown if currently breastfeeding.  Physical Exam:  General: alert and cooperative Lochia: appropriate Uterine Fundus: firm   Recent Labs    12/26/18 1104  HGB 12.3  HCT 37.5    Assessment/Plan: Plan for discharge tomorrow  D/w pt isolation preautions and plan circ in 10 days     LOS: 2 days   Logan Bores 12/28/2018, 10:53 AM

## 2019-01-12 ENCOUNTER — Encounter: Payer: Self-pay | Admitting: Emergency Medicine

## 2019-07-27 ENCOUNTER — Encounter: Payer: Self-pay | Admitting: Family Medicine

## 2019-07-27 ENCOUNTER — Telehealth (INDEPENDENT_AMBULATORY_CARE_PROVIDER_SITE_OTHER): Payer: Managed Care, Other (non HMO) | Admitting: Family Medicine

## 2019-07-27 DIAGNOSIS — R0981 Nasal congestion: Secondary | ICD-10-CM

## 2019-07-27 DIAGNOSIS — J029 Acute pharyngitis, unspecified: Secondary | ICD-10-CM

## 2019-07-27 DIAGNOSIS — Z7185 Encounter for immunization safety counseling: Secondary | ICD-10-CM

## 2019-07-27 DIAGNOSIS — Z7189 Other specified counseling: Secondary | ICD-10-CM | POA: Diagnosis not present

## 2019-07-27 MED ORDER — DOXYCYCLINE HYCLATE 100 MG PO TABS: 100.0000 mg | ORAL_TABLET | Freq: Two times a day (BID) | ORAL | 0 refills | Status: DC

## 2019-07-27 NOTE — Patient Instructions (Signed)
INSTRUCTIONS FOR UPPER RESPIRATORY INFECTION:  -plenty of rest and fluids  -nasal saline wash 2-3 times daily (use prepackaged nasal saline or bottled/distilled water if making your own)   -can use AFRIN nasal spray for drainage and nasal congestion - but do NOT use longer then 3-4 days  -can use tylenol (in no history of liver disease) or ibuprofen (if no history of kidney disease, bowel bleeding or significant heart disease) as directed for aches and sorethroat  -if you are taking a cough medication - use only as directed, may also try a teaspoon of honey to coat the throat and throat lozenges.  -for sore throat, salt water gargles can help  -consider COVID19 testing  -please get your COVID19 vaccine when you are feeling better.  -I sent the medication(s) we discussed to your pharmacy to take if worsening or not improving as expected per our discussion. Meds ordered this encounter  Medications  . doxycycline (VIBRA-TABS) 100 MG tablet    Sig: Take 1 tablet (100 mg total) by mouth 2 (two) times daily.    Dispense:  14 tablet    Refill:  0    Please let us know if you have any questions or concerns regarding this prescription.  I hope you are feeling better soon! Seek care promptly if your symptoms worsen, new concerns arise or you are not improving with treatment.   Upper Respiratory Infection, Adult An upper respiratory infection (URI) is also known as the common cold. It is often caused by a type of germ (virus). Colds are easily spread (contagious). You can pass it to others by kissing, coughing, sneezing, or drinking out of the same glass. Usually, you get better in 1 to 3  weeks.  However, the cough can last for even longer. HOME CARE   Only take medicine as told by your doctor. Follow instructions provided above.  Drink enough water and fluids to keep your pee (urine) clear or pale yellow.  Get plenty of rest.  Return to work when your temperature is < 100 for 24  hours or as told by your doctor. You may use a face mask and wash your hands to stop your cold from spreading. GET HELP RIGHT AWAY IF:   After the first few days, you feel you are getting worse.  You have questions about your medicine.  You have chills, shortness of breath, or red spit (mucus).  You have pain in the face for more then 1-2 days, especially when you bend forward.  You have a fever, puffy (swollen) neck, pain when you swallow, or white spots in the back of your throat.  You have a bad headache, ear pain, sinus pain, or chest pain.  You have a high-pitched whistling sound when you breathe in and out (wheezing).  You cough up blood.  You have sore muscles or a stiff neck. MAKE SURE YOU:   Understand these instructions.  Will watch your condition.  Will get help right away if you are not doing well or get worse. Document Released: 08/14/2007 Document Revised: 05/20/2011 Document Reviewed: 06/02/2013 [employer] Patient Information 2015 [place], [place]. This information is not intended to replace advice given to you by your health care provider. Make sure you discuss any questions you have with your health care provider.

## 2019-07-27 NOTE — Progress Notes (Signed)
Virtual Visit via Video Note  I connected with Ashley Buckley  on 07/27/19 at 11:20 AM EDT by a video enabled telemedicine application and verified that I am speaking with the correct person using two identifiers.  Location patient: home, [place] Location provider:work or home office Persons participating in the virtual visit: patient, provider, small son  I discussed the limitations of evaluation and management by telemedicine and the availability of in person appointments. The patient expressed understanding and agreed to proceed.   HPI:  Acute visit for sinus issue: -started about 1 week or more ago, but worsening -symptoms include nasal congestion, PND, sore throat, mild cough initially - dry cough, hot fluids, frontal sinus HA, nasal congestion is now yellow (this feels similar to her as a prior sinus infection) -she tried an over the counter nasal spray, cough drops -denies fevers, body aches, SOB, NVD, rashes, loss of taste  -denies any sick contacts in the last few weeks -minimal exposures outside the home -denies any issues with seasonal allergies -has not had COVID19 vaccine -she had a positive COVID19 test when was admitted last year for delivery of baby (she was asymptomatic)  ROS: See pertinent positives and negatives per HPI.  Past Medical History:  Diagnosis Date  . Closed left arm fracture   . COVID-19 affecting childbirth 12/26/2018  . SVD (spontaneous vaginal delivery) 12/26/2018    Past Surgical History:  Procedure Laterality Date  . NO PAST SURGERIES      Family History  Problem Relation Age of Onset  . Hypertension Mother   . Diabetes Father   . Hypertension Maternal Uncle   . Diabetes Maternal Uncle   . Hypertension Maternal Grandmother   . Hypertension Maternal Grandfather   . Cancer Maternal Grandfather        Lung and Bladder  . Diabetes Maternal Grandfather   . Healthy Sister        x 4    SOCIAL HX: see hpi   Current Outpatient Medications:  .   levonorgestrel (MIRENA) 20 MCG/24HR IUD, 1 each by Intrauterine route once., Disp: , Rfl:  .  doxycycline (VIBRA-TABS) 100 MG tablet, Take 1 tablet (100 mg total) by mouth 2 (two) times daily., Disp: 14 tablet, Rfl: 0  EXAM:  VITALS per patient if applicable:  GENERAL: alert, oriented, appears well and in no acute distress  HEENT: atraumatic, conjunttiva clear, no obvious abnormalities on inspection of external nose and ears, did look at oropharynx over the video visit - mild post oropharyngeal erythema, no tonsillar hypertrophy or exudate  NECK: normal movements of the head and neck  LUNGS: on inspection no signs of respiratory distress, breathing rate appears normal, no obvious gross SOB, gasping or wheezing  CV: no obvious cyanosis  MS: moves all visible extremities without noticeable abnormality  PSYCH/NEURO: pleasant and cooperative, no obvious depression or anxiety, speech and thought processing grossly intact  ASSESSMENT AND PLAN:  Discussed the following assessment and plan:  Sinus congestion  Sore throat  Vaccine counseling  -we discussed possible serious and likely etiologies, options for evaluation and workup, limitations of telemedicine visit vs in person visit, treatment, treatment risks and precautions. Pt prefers to treat via telemedicine empirically rather then risking or undertaking an in person visit at this moment. Query VURI most likely vs other. Did discuss possibility of developing sinusitis though duration makes this less likely. She prefers Rx for abx incase is not improving or worsening over the next few days - after discussion current guidelines/risks/precuations sent  doxy 100mg  bid x 10 days. Denies pregnancy or lactation. Trial nasal saline an d short course nasal decongestant first. Also discussed posisbility of COVID 19, ? prior infection, vaccine, immunity and testing options and limitations. She is isolating anyway. Patient agrees to seek prompt in  person care if worsening, new symptoms arise, or if is not improving with treatment.   I discussed the assessment and treatment plan with the patient. The patient was provided an opportunity to ask questions and all were answered. The patient agreed with the plan and demonstrated an understanding of the instructions.   The patient was advised to call back or seek an in-person evaluation if the symptoms worsen or if the condition fails to improve as anticipated.   Lucretia Kern, DO

## 2019-09-15 ENCOUNTER — Encounter: Payer: Self-pay | Admitting: Physician Assistant

## 2019-09-15 ENCOUNTER — Other Ambulatory Visit: Payer: Self-pay

## 2019-09-15 ENCOUNTER — Telehealth (INDEPENDENT_AMBULATORY_CARE_PROVIDER_SITE_OTHER): Payer: Managed Care, Other (non HMO) | Admitting: Physician Assistant

## 2019-09-15 DIAGNOSIS — J019 Acute sinusitis, unspecified: Secondary | ICD-10-CM

## 2019-09-15 DIAGNOSIS — B9689 Other specified bacterial agents as the cause of diseases classified elsewhere: Secondary | ICD-10-CM | POA: Diagnosis not present

## 2019-09-15 MED ORDER — AMOXICILLIN-POT CLAVULANATE 875-125 MG PO TABS: 1.0000 | ORAL_TABLET | Freq: Two times a day (BID) | ORAL | 0 refills | Status: DC

## 2019-09-15 NOTE — Patient Instructions (Signed)
Instructions sent to MyChart

## 2019-09-15 NOTE — Progress Notes (Signed)
° °  Virtual Visit via Video   I connected with patient on 09/15/19 at  2:30 PM EDT by a video enabled telemedicine application and verified that I am speaking with the correct person using two identifiers.  Location patient: Home Location provider: [place], Office Persons participating in the virtual visit: Patient, Provider, CMA (Patina Moore)  I discussed the limitations of evaluation and management by telemedicine and the availability of in person appointments. The patient expressed understanding and agreed to proceed.  Subjective:   HPI:   Patient presents via [facility] today c/o 1+ weeks of nasal congestion, PND, sinus pressure and discomfort. Has noted sinus headache. Denies ear pressure or ear pain. Denies loss of taste or smell. Denies recent travel. Notes her sister was sick about 2 weeks ago but better now. Had a sinus infection. Notes nasal drainage has become thick and yellow-green. Has been using Mucinex OTC at home with little relief. Denies concerns for COVID.   ROS:   See pertinent positives and negatives per HPI.  Patient Active Problem List   Diagnosis Date Noted   Indication for care in labor or delivery 12/26/2018   Normal pregnancy in third trimester 12/26/2018   SVD (spontaneous vaginal delivery) 12/26/2018   COVID-19 affecting childbirth 12/26/2018   Generalized anxiety disorder 02/09/2018   Need for immunization against influenza 11/12/2016   Annual physical exam 11/12/2016    Social History   Tobacco Use   Smoking status: Never Smoker   Smokeless tobacco: Never Used  Substance Use Topics   Alcohol use: No    Current Outpatient Medications:    levonorgestrel (MIRENA) 20 MCG/24HR IUD, 1 each by Intrauterine route once., Disp: , Rfl:   No Known Allergies  Objective:   Breastfeeding No   Patient is well-developed, well-nourished in no acute distress.  Resting comfortably at home.  Head is normocephalic, atraumatic.  No  labored breathing.  Speech is clear and coherent with logical content.  Patient is alert and oriented at baseline.  + TTP left sinuses  Assessment and Plan:   1. Acute bacterial sinusitis Rx Augmentin.  Increase fluids.  Rest.  Saline nasal spray.  Probiotic.  Mucinex as directed.  Humidifier in bedroom. OTC Flonase recommended.  Call or return to clinic if symptoms are not improving.     Piedad Climes, PA-C 09/15/2019

## 2019-09-15 NOTE — Progress Notes (Signed)
I have discussed the procedure for the virtual visit with the patient who has given consent to proceed with assessment and treatment.   Ashley Buckley [name], CMA     

## 2019-11-01 ENCOUNTER — Other Ambulatory Visit: Payer: Self-pay

## 2019-11-01 ENCOUNTER — Encounter: Payer: Self-pay | Admitting: Physician Assistant

## 2019-11-01 ENCOUNTER — Telehealth: Payer: Self-pay | Admitting: Physician Assistant

## 2019-11-01 ENCOUNTER — Ambulatory Visit: Payer: Managed Care, Other (non HMO) | Admitting: Physician Assistant

## 2019-11-01 VITALS — BP 124/80 | HR 78 | Temp 98.5°F | Resp 16 | Ht 65.0 in | Wt 201.0 lb

## 2019-11-01 DIAGNOSIS — K219 Gastro-esophageal reflux disease without esophagitis: Secondary | ICD-10-CM

## 2019-11-01 DIAGNOSIS — R1084 Generalized abdominal pain: Secondary | ICD-10-CM | POA: Diagnosis not present

## 2019-11-01 MED ORDER — PANTOPRAZOLE SODIUM 40 MG PO TBEC: 40.0000 mg | DELAYED_RELEASE_TABLET | Freq: Every day | ORAL | 3 refills | Status: AC

## 2019-11-01 NOTE — Telephone Encounter (Signed)
Per the health team pt needs to be seen within 24 hrs

## 2019-11-01 NOTE — Patient Instructions (Signed)
Avoid late-night eating. Elevate the head of your bed. Follow the dietary recommendations below. Start the Protonix once daily instead of OTC Omeprazole (Prilosec). Start nightly OTC Pepcid as well for a few days.   We will adjust medications, etc based on lab results.   Hang in there!   Gastroesophageal Reflux Disease, Adult Gastroesophageal reflux (GER) happens when acid from the stomach flows up into the tube that connects the mouth and the stomach (esophagus). Normally, food travels down the esophagus and stays in the stomach to be digested. With GER, food and stomach acid sometimes move back up into the esophagus. You may have a disease called gastroesophageal reflux disease (GERD) if the reflux:  Happens often.  Causes frequent or very bad symptoms.  Causes problems such as damage to the esophagus. When this happens, the esophagus becomes sore and swollen (inflamed). Over time, GERD can make small holes (ulcers) in the lining of the esophagus. What are the causes? This condition is caused by a problem with the muscle between the esophagus and the stomach. When this muscle is weak or not normal, it does not close properly to keep food and acid from coming back up from the stomach. The muscle can be weak because of:  Tobacco use.  Pregnancy.  Having a certain type of hernia (hiatal hernia).  Alcohol use.  Certain foods and drinks, such as coffee, chocolate, onions, and peppermint. What increases the risk? You are more likely to develop this condition if you:  Are overweight.  Have a disease that affects your connective tissue.  Use NSAID medicines. What are the signs or symptoms? Symptoms of this condition include:  Heartburn.  Difficult or painful swallowing.  The feeling of having a lump in the throat.  A bitter taste in the mouth.  Bad breath.  Having a lot of saliva.  Having an upset or bloated stomach.  Belching.  Chest pain. Different conditions  can cause chest pain. Make sure you see your doctor if you have chest pain.  Shortness of breath or noisy breathing (wheezing).  Ongoing (chronic) cough or a cough at night.  Wearing away of the surface of teeth (tooth enamel).  Weight loss. How is this treated? Treatment will depend on how bad your symptoms are. Your doctor may suggest:  Changes to your diet.  Medicine.  Surgery. Follow these instructions at home: Eating and drinking   Follow a diet as told by your doctor. You may need to avoid foods and drinks such as: ? Coffee and tea (with or without caffeine). ? Drinks that contain alcohol. ? Energy drinks and sports drinks. ? Bubbly (carbonated) drinks or sodas. ? Chocolate and cocoa. ? Peppermint and mint flavorings. ? Garlic and onions. ? Horseradish. ? Spicy and acidic foods. These include peppers, chili powder, curry powder, vinegar, hot sauces, and BBQ sauce. ? Citrus fruit juices and citrus fruits, such as oranges, lemons, and limes. ? Tomato-based foods. These include red sauce, chili, salsa, and pizza with red sauce. ? Fried and fatty foods. These include donuts, french fries, potato chips, and high-fat dressings. ? High-fat meats. These include hot dogs, rib eye steak, sausage, ham, and bacon. ? High-fat dairy items, such as whole milk, butter, and cream cheese.  Eat small meals often. Avoid eating large meals.  Avoid drinking large amounts of liquid with your meals.  Avoid eating meals during the 2-3 hours before bedtime.  Avoid lying down right after you eat.  Do not exercise right after you  eat. Lifestyle   Do not use any products that contain nicotine or tobacco. These include cigarettes, e-cigarettes, and chewing tobacco. If you need help quitting, ask your doctor.  Try to lower your stress. If you need help doing this, ask your doctor.  If you are overweight, lose an amount of weight that is healthy for you. Ask your doctor about a safe weight  loss goal. General instructions  Pay attention to any changes in your symptoms.  Take over-the-counter and prescription medicines only as told by your doctor. Do not take aspirin, ibuprofen, or other NSAIDs unless your doctor says it is okay.  Wear loose clothes. Do not wear anything tight around your waist.  Raise (elevate) the head of your bed about 6 inches (15 cm).  Avoid bending over if this makes your symptoms worse.  Keep all follow-up visits as told by your doctor. This is important. Contact a doctor if:  You have new symptoms.  You lose weight and you do not know why.  You have trouble swallowing or it hurts to swallow.  You have wheezing or a cough that keeps happening.  Your symptoms do not get better with treatment.  You have a hoarse voice. Get help right away if:  You have pain in your arms, neck, jaw, teeth, or back.  You feel sweaty, dizzy, or light-headed.  You have chest pain or shortness of breath.  You throw up (vomit) and your throw-up looks like blood or coffee grounds.  You pass out (faint).  Your poop (stool) is bloody or black.  You cannot swallow, drink, or eat. Summary  If a person has gastroesophageal reflux disease (GERD), food and stomach acid move back up into the esophagus and cause symptoms or problems such as damage to the esophagus.  Treatment will depend on how bad your symptoms are.  Follow a diet as told by your doctor.  Take all medicines only as told by your doctor. This information is not intended to replace advice given to you by your health care provider. Make sure you discuss any questions you have with your health care provider. Document Revised: 09/03/2017 Document Reviewed: 09/03/2017 Elsevier Patient Education  2020 [employer].

## 2019-11-01 NOTE — Telephone Encounter (Signed)
Pt scheduled for an in office

## 2019-11-01 NOTE — Progress Notes (Signed)
Patient presents to clinic today c/o 2.5 days of abdominal discomfort, sometimes upper abdomen and at other times more generalized. This is associated with heartburn which she notes has been a problem for her since pregnancy a year ago but recently worsening. Notes symptoms began after eating a spicy meal at a [employer]. Denies fever, chills, nausea or vomiting. Denies change to bowel or bladder habits. Is taking OTC Prilosec but not noting much improvement in her reflux symptoms. No pain noted at present.   Past Medical History:  Diagnosis Date  . Closed left arm fracture   . COVID-19 affecting childbirth 12/26/2018  . SVD (spontaneous vaginal delivery) 12/26/2018    Current Outpatient Medications on File Prior to Visit  Medication Sig Dispense Refill  . levonorgestrel (MIRENA) 20 MCG/24HR IUD 1 each by Intrauterine route once.    Marland Kitchen omeprazole (PRILOSEC OTC) 20 MG tablet Take 20 mg by mouth daily.     No current facility-administered medications on file prior to visit.    No Known Allergies  Family History  Problem Relation Age of Onset  . Hypertension Mother   . Diabetes Father   . Hypertension Maternal Uncle   . Diabetes Maternal Uncle   . Hypertension Maternal Grandmother   . Hypertension Maternal Grandfather   . Cancer Maternal Grandfather        Lung and Bladder  . Diabetes Maternal Grandfather   . Healthy Sister        x 4    Social History   Socioeconomic History  . Marital status: Single    Spouse name: Not on file  . Number of children: 0  . Years of education: Not on file  . Highest education level: Not on file  Occupational History  . 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 Smoking status: Never Smoker  . Smokeless tobacco: Never Used  Vaping Use  . Vaping Use: Never used  Substance and Sexual Activity  . Alcohol use: No  . Drug use: No  . Sexual activity: Never    Birth control/protection: Pill  Other Topics  Concern  . Not on file  Social History Narrative  . Not on file   Social Determinants of Health   Financial Resource Strain:   . Difficulty of Paying Living Expenses: Not on file  Food Insecurity:   . Worried About Programme researcher, broadcasting/film/video in the Last Year: Not on file  . Ran Out of Food in the Last Year: Not on file  Transportation Needs:   . Lack of Transportation (Medical): Not on file  . Lack of Transportation (Non-Medical): Not on file  Physical Activity:   . Days of Exercise per Week: Not on file  . Minutes of Exercise per Session: Not on file  Stress:   . Feeling of Stress : Not on file  Social Connections:   . Frequency of Communication with Friends and Family: Not on file  . Frequency of Social Gatherings with Friends and Family: Not on file  . Attends Religious Services: Not on file  . Active Member of Clubs or Organizations: Not on file  . Attends Banker Meetings: Not on file  . Marital Status: Not on file   Review of Systems - See HPI.  All other ROS are negative.  Wt 201 lb (91.2 kg)   BMI 33.45 kg/m   Physical Exam Vitals reviewed.  Constitutional:      Appearance: She is  well-developed.  HENT:     Head: Normocephalic and atraumatic.  Cardiovascular:     Rate and Rhythm: Normal rate and regular rhythm.  Pulmonary:     Effort: Pulmonary effort is normal.     Breath sounds: Normal breath sounds.  Abdominal:     General: Bowel sounds are normal.     Palpations: Abdomen is soft.     Tenderness: There is abdominal tenderness in the epigastric area and left upper quadrant. There is no right CVA tenderness, left CVA tenderness, guarding or rebound. Negative signs include Murphy's sign and McBurney's sign.  Neurological:     General: No focal deficit present.     Mental Status: She is alert.  Psychiatric:        Mood and Affect: Mood normal.    Assessment/Plan: 1. Gastroesophageal reflux disease without esophagitis 2. Generalized abdominal  pain No alarm signs or symptoms present. Seems most likely gastritis 2/2 uncontrolled GERD. Will check lab panel today to include h pylori testing. Stop Prilosec. Rx Protonix 40 mg daily. Famotidine 20 mg nightly for the next few nights. GERD diet reviewed. Close follow-up discussed.  - CBC with Differential/Platelet - Comprehensive metabolic panel - Urinalysis, Routine w reflex microscopic - Lipase - H. pylori antibody, IgG  This visit occurred during the SARS-CoV-2 public health emergency.  Safety protocols were in place, including screening questions prior to the visit, additional usage of staff PPE, and extensive cleaning of exam room while observing appropriate contact time as indicated for disinfecting solutions.     Piedad Climes, PA-C

## 2019-11-01 NOTE — Telephone Encounter (Signed)
Pt called in stating that she is having some lower abd pain aswell as heartburn. She states that her pain is at a level 7. I have sent her to the nurse line to have someone talk to her. Please advise if this is ok for an in office visit if the nurse team states she needs an in office visit.

## 2019-11-01 NOTE — Telephone Encounter (Signed)
Ok for in office if afebrile and no COVID symptoms.

## 2019-11-02 ENCOUNTER — Ambulatory Visit: Payer: Managed Care, Other (non HMO)

## 2019-11-02 LAB — URINALYSIS, ROUTINE W REFLEX MICROSCOPIC
Bilirubin Urine: NEGATIVE
Hgb urine dipstick: NEGATIVE
Ketones, ur: NEGATIVE
Leukocytes,Ua: NEGATIVE
Nitrite: NEGATIVE
Specific Gravity, Urine: 1.02 (ref 1.000–1.030)
Total Protein, Urine: NEGATIVE
Urine Glucose: NEGATIVE
Urobilinogen, UA: 0.2 (ref 0.0–1.0)
pH: 6.5 (ref 5.0–8.0)

## 2019-11-03 ENCOUNTER — Ambulatory Visit: Payer: Managed Care, Other (non HMO)

## 2019-11-03 ENCOUNTER — Encounter: Payer: Self-pay | Admitting: Physician Assistant

## 2020-01-28 IMAGING — DX DG ABDOMEN 2V
2 series · 2 of 2 positions shown · non-contrast
Comparison: No prior.

CLINICAL DATA: Epigastric abdominal pain.

EXAM:
ABDOMEN - 2 VIEW

[abdomen standing ap]
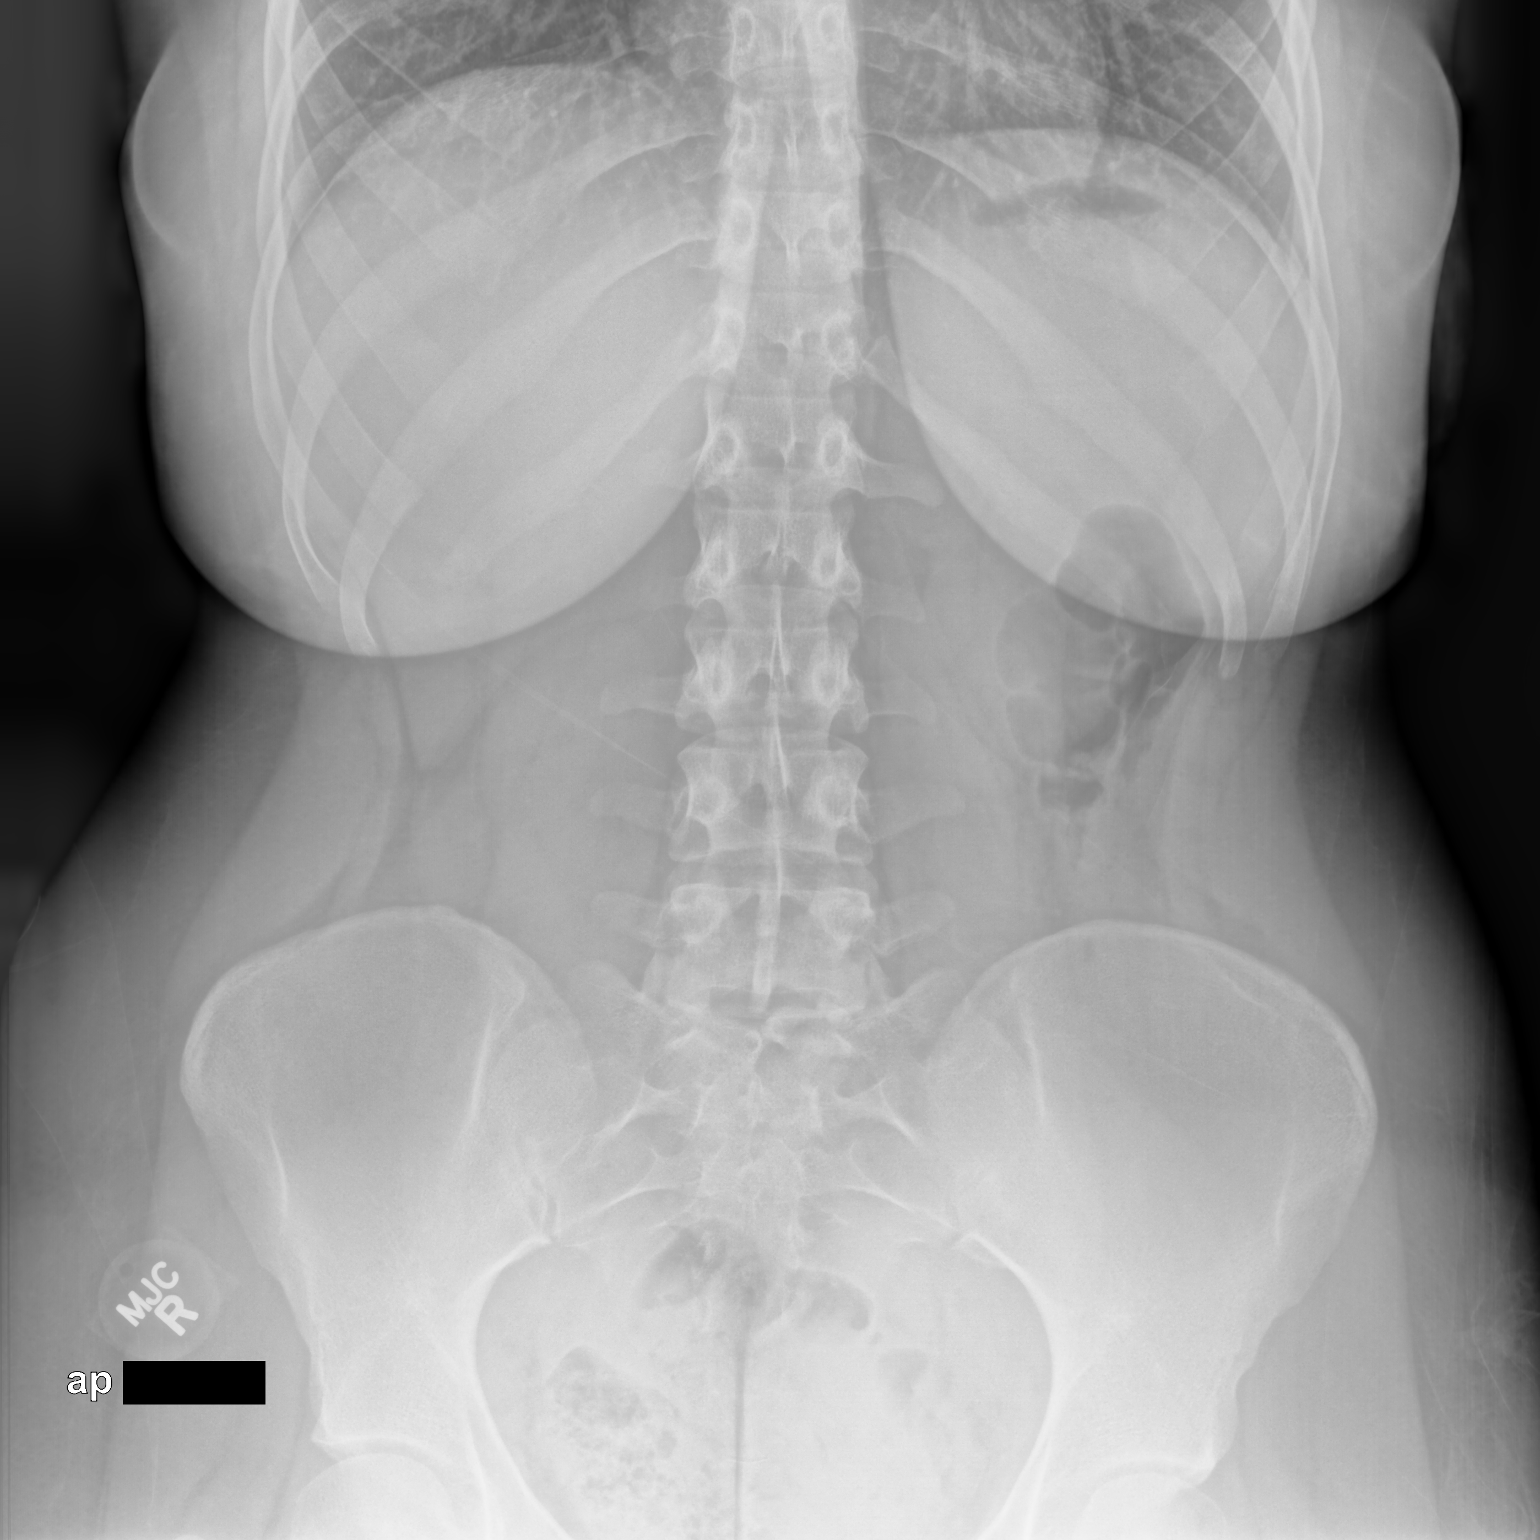

[kub ap]
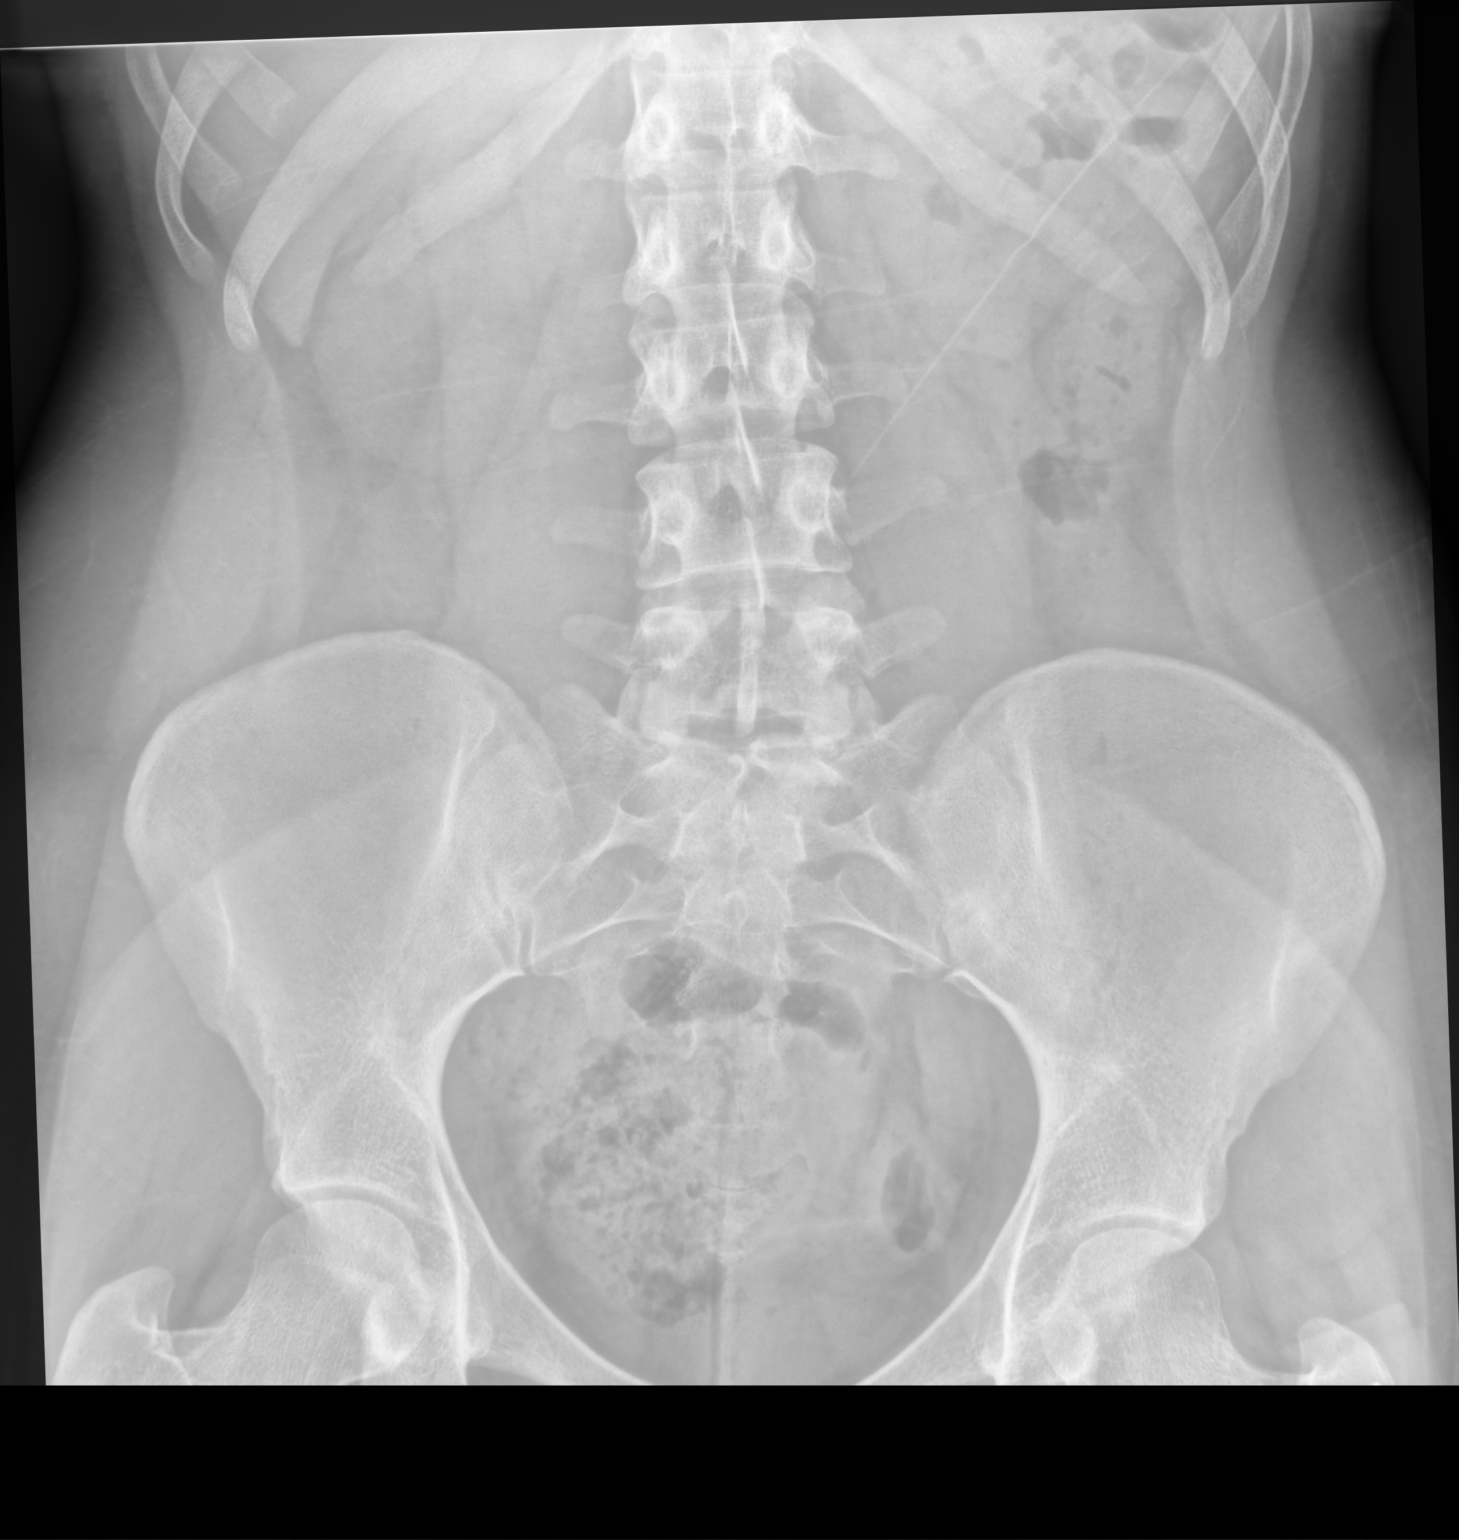

[2 of 2 positions shown; findings below may reference images not displayed]

FINDINGS: Soft tissue structures are unremarkable. Prominent amount of stool
noted throughout the colon. No bowel distention or free air. No
acute bony abnormality.
IMPRESSION: 1. Prominent amount of stool noted throughout the colon.
Constipation cannot be excluded. No bowel distention. No acute
abnormality.

## 2020-03-27 ENCOUNTER — Telehealth: Payer: Medicaid Other | Admitting: Physician Assistant

## 2020-03-31 ENCOUNTER — Encounter: Payer: Self-pay | Admitting: Physician Assistant

## 2020-03-31 ENCOUNTER — Other Ambulatory Visit: Payer: Self-pay

## 2020-03-31 ENCOUNTER — Telehealth (INDEPENDENT_AMBULATORY_CARE_PROVIDER_SITE_OTHER): Payer: BC Managed Care – PPO | Admitting: Physician Assistant

## 2020-03-31 DIAGNOSIS — R3915 Urgency of urination: Secondary | ICD-10-CM | POA: Diagnosis not present

## 2020-03-31 DIAGNOSIS — F411 Generalized anxiety disorder: Secondary | ICD-10-CM | POA: Diagnosis not present

## 2020-03-31 MED ORDER — CITALOPRAM HYDROBROMIDE 10 MG PO TABS: 10.0000 mg | ORAL_TABLET | Freq: Every day | ORAL | 1 refills | Status: DC

## 2020-03-31 NOTE — Progress Notes (Signed)
   Virtual Visit via Video   I connected with patient on 03/31/20 at 10:00 AM EST by a video enabled telemedicine application and verified that I am speaking with the correct person using two identifiers.  Location patient: Home Location provider: [facility], Office Persons participating in the virtual visit: Patient, Provider, [place] (Patina Moore)  I discussed the limitations of evaluation and management by telemedicine and the availability of in person appointments. The patient expressed understanding and agreed to proceed.  Subjective:   HPI:   Patient presents via [facility] today to discuss a couple of concerns.   Patient with history of GAD, previously on a regimen of Citalopram 10 mg daily. Notes doing very well without medication throughout pregnancy. Notes over the past few months having recurrence of generalized anxiety with some episodes of more acute anxiety. Is not affecting sleep. Would like to restart medication.  Patient also notes increase in urination over the past month with increased thirst. Occasional headache. Unsure of polyuria. Has strong family history of diabetes. No issue with insulin resistance or gestational diabetes during her pregnancy in 2020.   ROS:   See pertinent positives and negatives per HPI.  Patient Active Problem List   Diagnosis Date Noted  . Indication for care in labor or delivery 12/26/2018  . Normal pregnancy in third trimester 12/26/2018  . SVD (spontaneous vaginal delivery) 12/26/2018  . COVID-19 affecting childbirth 12/26/2018  . Generalized anxiety disorder 02/09/2018  . Need for immunization against influenza 11/12/2016  . Annual physical exam 11/12/2016    Social History   Tobacco Use  . Smoking status: Never Smoker  . Smokeless tobacco: Never Used  Substance Use Topics  . Alcohol use: No    Current Outpatient Medications:  .  levonorgestrel (MIRENA) 20 MCG/24HR IUD, 1 each by Intrauterine route once., Disp: ,  Rfl:  .  pantoprazole (PROTONIX) 40 MG tablet, Take 1 tablet (40 mg total) by mouth daily., Disp: 30 tablet, Rfl: 3  No Known Allergies  Objective:   There were no vitals taken for this visit.  Patient is well-developed, well-nourished in no acute distress.  Resting comfortably at home.  Head is normocephalic, atraumatic.  No labored breathing.  Speech is clear and coherent with logical content.  Patient is alert and oriented at baseline.   Assessment and Plan:   1. Generalized anxiety disorder Did very well previously with Citalopram. Plan on restarting at 10 mg daily. Rx sent. Plan for follow-up in 3-4 weeks.  - citalopram (CELEXA) 10 MG tablet; Take 1 tablet (10 mg total) by mouth daily.  Dispense: 30 tablet; Refill: 1  2. Urinary urgency Will get her in to check renal function glucose and urine to assess for mild festering infection, diabetes, etc. Will treat according to findings. Lab appt scheduled for Monday. - Comp Met (CMET); Future - Urinalysis, Routine w reflex microscopic; Future - Urine Culture; Future .   Leeanne Rio, [place] 03/31/2020

## 2020-03-31 NOTE — Progress Notes (Signed)
I have discussed the procedure for the virtual visit with the patient who has given consent to proceed with assessment and treatment.   Kevaughn Ewing S Kyal Arts, CMA     

## 2020-03-31 NOTE — Patient Instructions (Signed)
Instructions sent to MyChart

## 2020-04-03 ENCOUNTER — Other Ambulatory Visit: Payer: Self-pay

## 2020-04-03 ENCOUNTER — Other Ambulatory Visit (INDEPENDENT_AMBULATORY_CARE_PROVIDER_SITE_OTHER): Payer: BC Managed Care – PPO

## 2020-04-03 DIAGNOSIS — R3915 Urgency of urination: Secondary | ICD-10-CM

## 2020-04-04 ENCOUNTER — Telehealth: Payer: Self-pay

## 2020-04-04 LAB — URINALYSIS, ROUTINE W REFLEX MICROSCOPIC
Bilirubin Urine: NEGATIVE
Ketones, ur: NEGATIVE
Leukocytes,Ua: NEGATIVE
Nitrite: NEGATIVE
Specific Gravity, Urine: 1.025 (ref 1.000–1.030)
Total Protein, Urine: NEGATIVE
Urine Glucose: NEGATIVE
Urobilinogen, UA: 0.2 (ref 0.0–1.0)
pH: 6.5 (ref 5.0–8.0)

## 2020-04-04 LAB — COMPREHENSIVE METABOLIC PANEL
ALT: 18 U/L (ref 0–35)
AST: 15 U/L (ref 0–37)
Albumin: 4.9 g/dL (ref 3.5–5.2)
Alkaline Phosphatase: 36 U/L — ABNORMAL LOW (ref 39–117)
BUN: 15 mg/dL (ref 6–23)
CO2: 27 mEq/L (ref 19–32)
Calcium: 9.9 mg/dL (ref 8.4–10.5)
Chloride: 105 mEq/L (ref 96–112)
Creatinine, Ser: 0.77 mg/dL (ref 0.40–1.20)
GFR: 109.96 mL/min (ref >60.00)
Glucose, Bld: 88 mg/dL (ref 70–99)
Potassium: 4.1 mEq/L (ref 3.5–5.1)
Sodium: 140 mEq/L (ref 135–145)
Total Bilirubin: 0.5 mg/dL (ref 0.2–1.2)
Total Protein: 7.6 g/dL (ref 6.0–8.3)

## 2020-04-04 NOTE — Telephone Encounter (Signed)
FYI  Patient has seen urinalysis and CMET results on Mychart.    I have advised her that she will receive a call as soon as these results have been reviewed.

## 2020-04-04 NOTE — Telephone Encounter (Signed)
PCP will review labs and give recommendations

## 2020-04-05 LAB — URINE CULTURE
MICRO NUMBER:: 11450474
Result:: NO GROWTH
SPECIMEN QUALITY:: ADEQUATE

## 2020-10-27 ENCOUNTER — Ambulatory Visit (INDEPENDENT_AMBULATORY_CARE_PROVIDER_SITE_OTHER): Payer: BC Managed Care – PPO | Admitting: Registered Nurse

## 2020-10-27 ENCOUNTER — Other Ambulatory Visit: Payer: Self-pay

## 2020-10-27 VITALS — BP 144/80 | HR 74 | Temp 98.3°F | Resp 18 | Ht 65.0 in | Wt 207.2 lb

## 2020-10-27 DIAGNOSIS — F411 Generalized anxiety disorder: Secondary | ICD-10-CM

## 2020-10-27 DIAGNOSIS — R3915 Urgency of urination: Secondary | ICD-10-CM

## 2020-10-27 DIAGNOSIS — G479 Sleep disorder, unspecified: Secondary | ICD-10-CM | POA: Diagnosis not present

## 2020-10-27 LAB — POCT URINALYSIS DIP (MANUAL ENTRY)
Bilirubin, UA: NEGATIVE
Glucose, UA: NEGATIVE mg/dL
Nitrite, UA: NEGATIVE
Spec Grav, UA: >=1.03 — AB (ref 1.010–1.025)
Urobilinogen, UA: 0.2 E.U./dL
pH, UA: 5 (ref 5.0–8.0)

## 2020-10-27 MED ORDER — SULFAMETHOXAZOLE-TRIMETHOPRIM 800-160 MG PO TABS: 1.0000 | ORAL_TABLET | Freq: Two times a day (BID) | ORAL | 0 refills | Status: DC

## 2020-10-29 ENCOUNTER — Encounter: Payer: Self-pay | Admitting: Registered Nurse

## 2020-10-29 LAB — URINE CULTURE
MICRO NUMBER:: 12267172
Result:: NO GROWTH
SPECIMEN QUALITY:: ADEQUATE

## 2020-10-30 ENCOUNTER — Other Ambulatory Visit: Payer: Self-pay | Admitting: Registered Nurse

## 2020-10-30 ENCOUNTER — Telehealth: Payer: Self-pay

## 2020-10-30 DIAGNOSIS — R3915 Urgency of urination: Secondary | ICD-10-CM

## 2020-10-30 DIAGNOSIS — B373 Candidiasis of vulva and vagina: Secondary | ICD-10-CM

## 2020-10-30 DIAGNOSIS — B3731 Acute candidiasis of vulva and vagina: Secondary | ICD-10-CM

## 2020-10-30 MED ORDER — FLUCONAZOLE 150 MG PO TABS: 150.0000 mg | ORAL_TABLET | Freq: Once | ORAL | 0 refills | Status: AC

## 2020-10-30 MED ORDER — NITROFURANTOIN MONOHYD MACRO 100 MG PO CAPS: 100.0000 mg | ORAL_CAPSULE | Freq: Two times a day (BID) | ORAL | 0 refills | Status: AC

## 2020-10-30 MED ORDER — FLUOXETINE HCL 20 MG PO CAPS: 20.0000 mg | ORAL_CAPSULE | Freq: Every day | ORAL | 3 refills | Status: DC

## 2020-10-30 MED ORDER — TRAZODONE HCL 50 MG PO TABS: 25.0000 mg | ORAL_TABLET | Freq: Every evening | ORAL | 3 refills | Status: DC | PRN

## 2020-10-30 NOTE — Telephone Encounter (Signed)
error 

## 2020-10-30 NOTE — Telephone Encounter (Signed)
UTI patient come in on Friday is not improving and now she feels as she is getting a yeast infection. She is wanting to know what she needs to do?   Also she has sent a online message about the anxiety meds was supposed to get Shanda Bumps has already forwarded that message.  Pt call back [phone]

## 2020-12-03 NOTE — Progress Notes (Signed)
Established Patient Office Visit  Subjective:  Patient ID: Ashley Buckley, female    DOB: Feb 22, 2001  Age: 20 y.o. MRN: [id number]  CC:  Chief Complaint  Patient presents with   Urinary Tract Infection    Patient states she is here for a possible UTI. She has been having frequent urination and back pain since about Sunday.also wants to discuss medication.    HPI ISA HITZ presents for UTI  Ongoing 3-4 days. Urinary urgency, frequency, and some flank pain Denies constitutional symptoms. No fevers, chills, fatigue. Denies vaginal symptoms and concern for UTI exposure.   Wants to discuss anxiety and depression and sleep disturbance. Has been on celexa in the past with good effect, but unsure how much it is helping  Interested in other options. Trouble falling asleep and staying asleep related to anxiety. Reports good sleep hygiene. Has tried OTCs with limited relief.  Past Medical History:  Diagnosis Date   Closed left arm fracture    COVID-19 affecting childbirth 12/26/2018   SVD (spontaneous vaginal delivery) 12/26/2018    Past Surgical History:  Procedure Laterality Date   NO PAST SURGERIES      Family History  Problem Relation Age of Onset   Hypertension Mother    Diabetes Father    Hypertension Maternal Uncle    Diabetes Maternal Uncle    Hypertension Maternal Grandmother    Hypertension Maternal Grandfather    Cancer Maternal Grandfather        Lung and Bladder   Diabetes Maternal Grandfather    Healthy Sister        x 4    Social History   Socioeconomic History   Marital status: Single    Spouse name: Not on file   Number of children: 0   Years of education: Not on file   Highest education level: Not on file  Occupational History   Occupation: Consulting civil engineer    Comment: [employer]  Tobacco Use   Smoking status: Never   Smokeless tobacco: Never  Vaping Use   Vaping Use: Never used  Substance and Sexual Activity   Alcohol use: No   Drug  use: No   Sexual activity: Never    Birth control/protection: Pill  Other Topics Concern   Not on file  Social History Narrative   Not on file   Social Determinants of Health   Financial Resource Strain: Not on file  Food Insecurity: Not on file  Transportation Needs: Not on file  Physical Activity: Not on file  Stress: Not on file  Social Connections: Not on file  Intimate Partner Violence: Not on file    Outpatient Medications Prior to Visit  Medication Sig Dispense Refill   levonorgestrel (MIRENA) 20 MCG/24HR IUD 1 each by Intrauterine route once.     pantoprazole (PROTONIX) 40 MG tablet Take 1 tablet (40 mg total) by mouth daily. 30 tablet 3   citalopram (CELEXA) 10 MG tablet Take 1 tablet (10 mg total) by mouth daily. (Patient not taking: Reported on 10/27/2020) 30 tablet 1   No facility-administered medications prior to visit.    No Known Allergies  ROS Review of Systems  Constitutional: Negative.   HENT: Negative.    Eyes: Negative.   Respiratory: Negative.    Cardiovascular: Negative.   Gastrointestinal: Negative.   Genitourinary:  Positive for flank pain, frequency and urgency.  Skin: Negative.   Neurological: Negative.   Psychiatric/Behavioral:  Positive for dysphoric mood and sleep disturbance. Negative for  self-injury and suicidal ideas. The patient is nervous/anxious.   All other systems reviewed and are negative.    Objective:    Physical Exam  BP (!) 144/80   Pulse 74   Temp 98.3 F (36.8 C) (Temporal)   Resp 18   Ht 5\' 5"  (1.651 m)   Wt 207 lb 3.2 oz (94 kg)   SpO2 98%   BMI 34.48 kg/m  Wt Readings from Last 3 Encounters:  10/27/20 207 lb 3.2 oz (94 kg)  11/01/19 201 lb (91.2 kg)  12/26/18 185 lb (83.9 kg)     Health Maintenance Due  Topic Date Due   COVID-19 Vaccine (1) Never done   PAP SMEAR-Modifier  Never done   INFLUENZA VACCINE  10/09/2020    There are no preventive care reminders to display for this patient.  No results  found for: TSH Lab Results  Component Value Date   WBC 12.7 (H) 12/26/2018   HGB 12.3 12/26/2018   HCT 37.5 12/26/2018   MCV 92.4 12/26/2018   PLT 328 12/26/2018   Lab Results  Component Value Date   NA 140 04/03/2020   K 4.1 04/03/2020   CO2 27 04/03/2020   GLUCOSE 88 04/03/2020   BUN 15 04/03/2020   CREATININE 0.77 04/03/2020   BILITOT 0.5 04/03/2020   ALKPHOS 36 (L) 04/03/2020   AST 15 04/03/2020   ALT 18 04/03/2020   PROT 7.6 04/03/2020   ALBUMIN 4.9 04/03/2020   CALCIUM 9.9 04/03/2020   ANIONGAP 12 12/26/2018   GFR 109.96 04/03/2020   No results found for: CHOL No results found for: HDL No results found for: LDLCALC No results found for: TRIG No results found for: CHOLHDL No results found for: 04/05/2020    Assessment & Plan:   Problem List Items Addressed This Visit       Other   Generalized anxiety disorder   Relevant Medications   FLUoxetine (PROZAC) 20 MG capsule   traZODone (DESYREL) 50 MG tablet   Other Visit Diagnoses     Urinary urgency    -  Primary   Relevant Orders   POCT urinalysis dipstick (Completed)   Urine Culture (Completed)   GAD (generalized anxiety disorder)       Relevant Medications   FLUoxetine (PROZAC) 20 MG capsule   traZODone (DESYREL) 50 MG tablet   Sleep disturbance       Relevant Medications   traZODone (DESYREL) 50 MG tablet       Meds ordered this encounter  Medications   DISCONTD: sulfamethoxazole-trimethoprim (BACTRIM DS) 800-160 MG tablet    Sig: Take 1 tablet by mouth 2 (two) times daily.    Dispense:  6 tablet    Refill:  0    Order Specific Question:   Supervising Provider    Answer:   [id number], JEFFREY R [[id number]]   FLUoxetine (PROZAC) 20 MG capsule    Sig: Take 1 capsule (20 mg total) by mouth daily.    Dispense:  90 capsule    Refill:  3    Order Specific Question:   Supervising Provider    Answer:   Neva Seat, JEFFREY R [[id number]]   traZODone (DESYREL) 50 MG tablet    Sig: Take 0.5-1 tablets (25-50 mg total)  by mouth at bedtime as needed for sleep.    Dispense:  30 tablet    Refill:  3    Order Specific Question:   Supervising Provider    Answer:   Neva Seat, JEFFREY R [[id number]]  Follow-up: No follow-ups on file.   PLAN Start fluoxetine 20mg  po qd. Monitor effect. Med check 6-8 weeks. Trazodone 25-50mg  po qhs prn. Discussed risks, benefits, and alternatives to each medication. Pt voices understanding POCT suggests UTI. Will treat presumptively with bactrim po bid x 3 days. Culture sent. Adjust treatment as indicated. Patient encouraged to call clinic with any questions, comments, or concerns.  , NP

## 2020-12-08 ENCOUNTER — Encounter: Payer: Self-pay | Admitting: Registered Nurse

## 2020-12-08 ENCOUNTER — Other Ambulatory Visit: Payer: Self-pay

## 2020-12-08 ENCOUNTER — Telehealth (INDEPENDENT_AMBULATORY_CARE_PROVIDER_SITE_OTHER): Payer: BC Managed Care – PPO | Admitting: Registered Nurse

## 2020-12-08 DIAGNOSIS — F411 Generalized anxiety disorder: Secondary | ICD-10-CM | POA: Diagnosis not present

## 2020-12-08 DIAGNOSIS — G479 Sleep disorder, unspecified: Secondary | ICD-10-CM | POA: Diagnosis not present

## 2020-12-08 MED ORDER — TRAZODONE HCL 50 MG PO TABS: 25.0000 mg | ORAL_TABLET | Freq: Every evening | ORAL | 3 refills | Status: AC | PRN

## 2020-12-08 MED ORDER — FLUOXETINE HCL 20 MG PO CAPS: 20.0000 mg | ORAL_CAPSULE | Freq: Every day | ORAL | 3 refills | Status: AC

## 2020-12-08 NOTE — Progress Notes (Signed)
Telemedicine Encounter- SOAP NOTE Established Patient  This telephone encounter was conducted with the patient's (or proxy's) verbal consent via audio telecommunications: yes/no: Yes Patient was instructed to have this encounter in a suitably private space; and to only have persons present to whom they give permission to participate. In addition, patient identity was confirmed by use of name plus two identifiers (DOB and address).  I discussed the limitations, risks, security and privacy concerns of performing an evaluation and management service by telephone and the availability of in person appointments. I also discussed with the patient that there may be a patient responsible charge related to this service. The patient expressed understanding and agreed to proceed.  I spent a total of 16 minutes talking with the patient or their proxy.  Patient at home Provider in office  Participants: Ashley Sportsman, NP and Ashley Buckley  Chief Complaint  Patient presents with   Follow-up    Patient states she is following up for her medications     Subjective   Ashley Buckley is a 20 y.o. established patient. Telephone visit today for med follow up   HPI Fluoxetine and trazodone  Working well, great improvement. Taking fluoxetine - every day, no missed doses. Working well at 20 mg. Taking trazodone - using 2-3 times each week. Uses 50mg .  First week or so had  No complaints or concerns at this time. Doing very well.  Patient Active Problem List   Diagnosis Date Noted   Indication for Buckley in labor or delivery 12/26/2018   Normal pregnancy in third trimester 12/26/2018   SVD (spontaneous vaginal delivery) 12/26/2018   COVID-19 affecting childbirth 12/26/2018   Generalized anxiety disorder 02/09/2018   Need for immunization against influenza 11/12/2016   Annual physical exam 11/12/2016    Past Medical History:  Diagnosis Date   Closed left arm fracture    COVID-19 affecting  childbirth 12/26/2018   SVD (spontaneous vaginal delivery) 12/26/2018    Current Outpatient Medications  Medication Sig Dispense Refill   levonorgestrel (MIRENA) 20 MCG/24HR IUD 1 each by Intrauterine route once.     pantoprazole (PROTONIX) 40 MG tablet Take 1 tablet (40 mg total) by mouth daily. 30 tablet 3   FLUoxetine (PROZAC) 20 MG capsule Take 1 capsule (20 mg total) by mouth daily. 90 capsule 3   traZODone (DESYREL) 50 MG tablet Take 0.5-1 tablets (25-50 mg total) by mouth at bedtime as needed for sleep. 30 tablet 3   No current facility-administered medications for this visit.    No Known Allergies  Social History   Socioeconomic History   Marital status: Single    Spouse name: Not on file   Number of children: 0   Years of education: Not on file   Highest education level: Not on file  Occupational History   Occupation: Student    Comment: [employer]  Tobacco Use   Smoking status: Never   Smokeless tobacco: Never  Vaping Use   Vaping Use: Never used  Substance and Sexual Activity   Alcohol use: No   Drug use: No   Sexual activity: Never    Birth control/protection: Pill  Other Topics Concern   Not on file  Social History Narrative   Not on file   Social Determinants of Health   Financial Resource Strain: Not on file  Food Insecurity: Not on file  Transportation Needs: Not on file  Physical Activity: Not on file  Stress: Not on file  Social Connections: Not on file  Intimate Partner Violence: Not on file    ROS  Objective   Vitals as reported by the patient: There were no vitals filed for this visit.  Ashley Buckley was seen today for follow-up.  Diagnoses and all orders for this visit:  GAD (generalized anxiety disorder) -     traZODone (DESYREL) 50 MG tablet; Take 0.5-1 tablets (25-50 mg total) by mouth at bedtime as needed for sleep. -     FLUoxetine (PROZAC) 20 MG capsule; Take 1 capsule (20 mg total) by mouth daily.  Sleep disturbance -      traZODone (DESYREL) 50 MG tablet; Take 0.5-1 tablets (25-50 mg total) by mouth at bedtime as needed for sleep.   PLAN Continue current regimen Med check in 6-12 mo Patient encouraged to call clinic with any questions, comments, or concerns.  I discussed the assessment and treatment plan with the patient. The patient was provided an opportunity to ask questions and all were answered. The patient agreed with the plan and demonstrated an understanding of the instructions.   The patient was advised to call back or seek an in-person evaluation if the symptoms worsen or if the condition fails to improve as anticipated.  I provided 14 minutes of non-face-to-face time during this encounter.  Ashley Agee, NP

## 2020-12-08 NOTE — Patient Instructions (Signed)
° ° ° °  If you have lab work done today you will be contacted with your lab results within the next 2 weeks.  If you have not heard from us then please contact us. The fastest way to get your results is to register for My Chart. ° ° °IF you received an x-ray today, you will receive an invoice from [facility]. Please contact [facility] at [phone] with questions or concerns regarding your invoice.  ° °IF you received labwork today, you will receive an invoice from [facility]. Please contact [facility] at [phone] with questions or concerns regarding your invoice.  ° °Our billing staff will not be able to assist you with questions regarding bills from these companies. ° °You will be contacted with the lab results as soon as they are available. The fastest way to get your results is to activate your My Chart account. Instructions are located on the last page of this paperwork. If you have not heard from us regarding the results in 2 weeks, please contact this office. °  ° ° ° °

## 2021-02-08 ENCOUNTER — Encounter: Payer: BC Managed Care – PPO | Admitting: Registered Nurse

## 2021-02-09 ENCOUNTER — Telehealth: Payer: BC Managed Care – PPO | Admitting: Registered Nurse

## 2021-02-13 ENCOUNTER — Encounter: Payer: Self-pay | Admitting: Family Medicine

## 2021-02-13 ENCOUNTER — Telehealth (INDEPENDENT_AMBULATORY_CARE_PROVIDER_SITE_OTHER): Payer: BC Managed Care – PPO | Admitting: Family Medicine

## 2021-02-13 DIAGNOSIS — B9689 Other specified bacterial agents as the cause of diseases classified elsewhere: Secondary | ICD-10-CM | POA: Diagnosis not present

## 2021-02-13 DIAGNOSIS — J329 Chronic sinusitis, unspecified: Secondary | ICD-10-CM | POA: Diagnosis not present

## 2021-02-13 MED ORDER — GUAIFENESIN-CODEINE 100-10 MG/5ML PO SYRP: 10.0000 mL | ORAL_SOLUTION | Freq: Three times a day (TID) | ORAL | 0 refills | Status: AC | PRN

## 2021-02-13 MED ORDER — AZITHROMYCIN 250 MG PO TABS: ORAL_TABLET | ORAL | 0 refills | Status: DC

## 2021-02-13 NOTE — Progress Notes (Signed)
   Virtual Visit via Video   I connected with patient on 02/13/21 at  1:00 PM EST by a video enabled telemedicine application and verified that I am speaking with the correct person using two identifiers.  Location patient: Home Location provider: [facility], Office Persons participating in the virtual visit: Patient, Provider, CMA Tresa Endo C)  I discussed the limitations of evaluation and management by telemedicine and the availability of in person appointments. The patient expressed understanding and agreed to proceed.  Subjective:   HPI:   URI- sxs started on 11/25.  Had e-visit and was given Omnicef 300mg  BID on 12/1 but this has not improved anything and sxs continue to worsen.  She states her sore throat is 'terrible' and at times she can barely swallow.  Also having ear pain, nasal congestion, green nasal drainage.  Dry cough started 4 days ago.  She had 2 days of fever at onset but none since.  + sick contacts.  COVID (-).  + HA and facial pain which is new.  NKDA  ROS:   See pertinent positives and negatives per HPI.  Patient Active Problem List   Diagnosis Date Noted   Indication for care in labor or delivery 12/26/2018   Normal pregnancy in third trimester 12/26/2018   SVD (spontaneous vaginal delivery) 12/26/2018   COVID-19 affecting childbirth 12/26/2018   Generalized anxiety disorder 02/09/2018   Need for immunization against influenza 11/12/2016   Annual physical exam 11/12/2016    Social History   Tobacco Use   Smoking status: Never   Smokeless tobacco: Never  Substance Use Topics   Alcohol use: No    Current Outpatient Medications:    cefdinir (OMNICEF) 300 MG capsule, Take 300 mg by mouth every 12 (twelve) hours., Disp: , Rfl:    FLUoxetine (PROZAC) 20 MG capsule, Take 1 capsule (20 mg total) by mouth daily., Disp: 90 capsule, Rfl: 3   levonorgestrel (MIRENA) 20 MCG/24HR IUD, 1 each by Intrauterine route once., Disp: , Rfl:    traZODone (DESYREL)  50 MG tablet, Take 0.5-1 tablets (25-50 mg total) by mouth at bedtime as needed for sleep., Disp: 30 tablet, Rfl: 3   pantoprazole (PROTONIX) 40 MG tablet, Take 1 tablet (40 mg total) by mouth daily. (Patient not taking: Reported on 02/13/2021), Disp: 30 tablet, Rfl: 3  No Known Allergies  Objective:   There were no vitals taken for this visit. AAOx3, NAD NCAT, EOMI No obvious CN deficits Coloring WNL Pt is able to speak clearly, coherently without shortness of breath or increased work of breathing.  Thought process is linear.  Mood is appropriate.   Assessment and Plan:   Bacterial sinusitis- new.  Pt's sxs are now consistent w/ bacterial infxn given duration of illness.  She has not shown any improvement w/ Omnicef so will switch classes entirely and start Zpack.  Codeine cough syrup prn.  Encouraged fluids, rest, ibuprofen as needed for pain/headache.  Pt expressed understanding and is in agreement w/ plan.    14/08/2020, MD 02/13/2021

## 2021-02-14 ENCOUNTER — Encounter: Payer: BC Managed Care – PPO | Admitting: Registered Nurse

## 2021-04-13 ENCOUNTER — Encounter: Payer: BC Managed Care – PPO | Admitting: Registered Nurse

## 2022-03-22 ENCOUNTER — Telehealth: Payer: Self-pay | Admitting: Urgent Care

## 2022-03-22 ENCOUNTER — Ambulatory Visit
Admission: EM | Admit: 2022-03-22 | Discharge: 2022-03-22 | Disposition: A | Discharge status: Home / Self Care | Payer: BC Managed Care – PPO | Attending: Urgent Care | Admitting: Urgent Care

## 2022-03-22 DIAGNOSIS — Z87440 Personal history of urinary (tract) infections: Secondary | ICD-10-CM | POA: Insufficient documentation

## 2022-03-22 DIAGNOSIS — N3001 Acute cystitis with hematuria: Secondary | ICD-10-CM | POA: Diagnosis not present

## 2022-03-22 DIAGNOSIS — R3915 Urgency of urination: Secondary | ICD-10-CM | POA: Insufficient documentation

## 2022-03-22 DIAGNOSIS — R350 Frequency of micturition: Secondary | ICD-10-CM | POA: Insufficient documentation

## 2022-03-22 LAB — POCT URINALYSIS DIP (MANUAL ENTRY)
Bilirubin, UA: NEGATIVE
Glucose, UA: NEGATIVE mg/dL
Ketones, POC UA: NEGATIVE mg/dL
Nitrite, UA: NEGATIVE
Protein Ur, POC: NEGATIVE mg/dL
Spec Grav, UA: 1.02 (ref 1.010–1.025)
Urobilinogen, UA: 0.2 E.U./dL
pH, UA: 6 (ref 5.0–8.0)

## 2022-03-22 LAB — POCT URINE PREGNANCY: Preg Test, Ur: NEGATIVE

## 2022-03-22 MED ORDER — FLUCONAZOLE 150 MG PO TABS: 150.0000 mg | ORAL_TABLET | ORAL | 0 refills | Status: AC

## 2022-03-22 MED ORDER — CEPHALEXIN 500 MG PO CAPS: 500.0000 mg | ORAL_CAPSULE | Freq: Two times a day (BID) | ORAL | 0 refills | Status: AC

## 2022-03-22 NOTE — ED Triage Notes (Signed)
Pt is here for a possible UTI, pt stated she did a teledoc and was prescribed Bactrim, pt states her symptoms has gotten worse.

## 2022-03-22 NOTE — Discharge Instructions (Addendum)
Please start Keflex to address an urinary tract infection. Make sure you hydrate very well with plain water and a quantity of 80 ounces of water a day.  Please limit drinks that are considered urinary irritants such as soda, sweet tea, coffee, energy drinks, alcohol.  These can worsen your urinary and genital symptoms but also be the source of them.  I will let you know about your urine culture and vaginal swab results through MyChart to see if we need to prescribe or change your antibiotics based off of those results.

## 2022-03-22 NOTE — Telephone Encounter (Signed)
Will send out prescription for oral fluconazole.  Forgot to send the prescription while patient was here.

## 2022-03-22 NOTE — ED Provider Notes (Signed)
[facility]  Note:  This document was prepared using Dragon voice recognition software and may include unintentional dictation errors.  MRN: [id number] DOB: Oct 14, 2000  Subjective:   Ashley Buckley is a 22 y.o. female presenting for 1 week history of acute onset persistent worsening urinary frequency, urinary urgency, dysuria. Did a teledoc visit and was started on Bactrim but is worse now. She has developed low back pain, pelvic pain/pressure. Has a history of UTIs and generally tries to hydrate very well which she has been doing. Has unprotected sex with 1 partner, is not sure it is exclusive.   No current facility-administered medications for this encounter.  Current Outpatient Medications:    azithromycin (ZITHROMAX) 250 MG tablet, 2 tabs on day 1, 1 tab on day 2-5, Disp: 6 tablet, Rfl: 0   cefdinir (OMNICEF) 300 MG capsule, Take 300 mg by mouth every 12 (twelve) hours., Disp: , Rfl:    FLUoxetine (PROZAC) 20 MG capsule, Take 1 capsule (20 mg total) by mouth daily., Disp: 90 capsule, Rfl: 3   guaiFENesin-codeine (ROBITUSSIN AC) 100-10 MG/5ML syrup, Take 10 mLs by mouth 3 (three) times daily as needed for cough., Disp: 120 mL, Rfl: 0   levonorgestrel (MIRENA) 20 MCG/24HR IUD, 1 each by Intrauterine route once., Disp: , Rfl:    pantoprazole (PROTONIX) 40 MG tablet, Take 1 tablet (40 mg total) by mouth daily. (Patient not taking: Reported on 02/13/2021), Disp: 30 tablet, Rfl: 3   traZODone (DESYREL) 50 MG tablet, Take 0.5-1 tablets (25-50 mg total) by mouth at bedtime as needed for sleep., Disp: 30 tablet, Rfl: 3   No Known Allergies  Past Medical History:  Diagnosis Date   Closed left arm fracture    COVID-19 affecting childbirth 12/26/2018   SVD (spontaneous vaginal delivery) 12/26/2018     Past Surgical History:  Procedure Laterality Date   NO PAST SURGERIES      Family History  Problem Relation Age of Onset   Hypertension Mother    Diabetes Father     Hypertension Maternal Uncle    Diabetes Maternal Uncle    Hypertension Maternal Grandmother    Hypertension Maternal Grandfather    Cancer Maternal Grandfather        Lung and Bladder   Diabetes Maternal Grandfather    Healthy Sister        x 4    Social History   Tobacco Use   Smoking status: Never   Smokeless tobacco: Never  Vaping Use   Vaping Use: Never used  Substance Use Topics   Alcohol use: No   Drug use: No    ROS   Objective:   Vitals: BP (!) 146/86 (BP Location: Left Arm)   Pulse (!) 107   Temp 98.2 F (36.8 C) (Oral)   Resp 16   LMP  (LMP Unknown)   SpO2 98%   Physical Exam Constitutional:      General: She is not in acute distress.    Appearance: Normal appearance. She is well-developed. She is not ill-appearing, toxic-appearing or diaphoretic.  HENT:     Head: Normocephalic and atraumatic.     Nose: Nose normal.     Mouth/Throat:     Mouth: Mucous membranes are moist.  Eyes:     General: No scleral icterus.       Right eye: No discharge.        Left eye: No discharge.     Extraocular Movements: Extraocular movements intact.  Conjunctiva/sclera: Conjunctivae normal.  Cardiovascular:     Rate and Rhythm: Normal rate.  Pulmonary:     Effort: Pulmonary effort is normal.  Abdominal:     General: Bowel sounds are normal. There is no distension.     Palpations: Abdomen is soft. There is no mass.     Tenderness: There is abdominal tenderness (mild lower, pelvic). There is no right CVA tenderness, left CVA tenderness, guarding or rebound.  Skin:    General: Skin is warm and dry.  Neurological:     General: No focal deficit present.     Mental Status: She is alert and oriented to person, place, and time.  Psychiatric:        Mood and Affect: Mood normal.        Behavior: Behavior normal.        Thought Content: Thought content normal.        Judgment: Judgment normal.     Results for orders placed or performed during the hospital  encounter of 03/22/22 (from the past 24 hour(s))  POCT urinalysis dipstick     Status: Abnormal   Collection Time: 03/22/22  2:04 PM  Result Value Ref Range   Color, UA yellow yellow   Clarity, UA clear clear   Glucose, UA negative negative mg/dL   Bilirubin, UA negative negative   Ketones, POC UA negative negative mg/dL   Spec Grav, UA 1.020 1.010 - 1.025   Blood, UA small (A) negative   pH, UA 6.0 5.0 - 8.0   Protein Ur, POC negative negative mg/dL   Urobilinogen, UA 0.2 0.2 or 1.0 E.U./dL   Nitrite, UA Negative Negative   Leukocytes, UA Trace (A) Negative  POCT urine pregnancy     Status: None   Collection Time: 03/22/22  2:04 PM  Result Value Ref Range   Preg Test, Ur Negative Negative    Assessment and Plan :   PDMP not reviewed this encounter.  1. Acute cystitis with hematuria   2. Urinary frequency   3. Urinary urgency   4. History of UTI     Start Keflex to cover for acute cystitis, urine culture pending.  Recommended aggressive hydration, limiting urinary irritants. Patient was agreeable to doing STI testing. Counseled patient on potential for adverse effects with medications prescribed/recommended today, ER and return-to-clinic precautions discussed, patient verbalized understanding.    Jaynee Eagles, PA-C 03/22/22 1420

## 2022-03-23 LAB — URINE CULTURE: Culture: <10000 — AB

## 2022-03-25 LAB — CERVICOVAGINAL ANCILLARY ONLY
Chlamydia: NEGATIVE
Comment: NEGATIVE
Comment: NEGATIVE
Comment: NORMAL
Neisseria Gonorrhea: NEGATIVE
Trichomonas: NEGATIVE
